# Patient Record
Sex: Male | Born: 1950 | Race: White | Hispanic: No | Marital: Married | Smoking: Current every day smoker
Health system: Southern US, Community
[De-identification: ages and names within clinical notes are randomized; demographics above are authoritative.]

## PROBLEM LIST (undated history)

## (undated) DIAGNOSIS — E1122 Type 2 diabetes mellitus with diabetic chronic kidney disease: Secondary | ICD-10-CM

## (undated) DIAGNOSIS — N289 Disorder of kidney and ureter, unspecified: Secondary | ICD-10-CM

## (undated) DIAGNOSIS — N185 Chronic kidney disease, stage 5: Secondary | ICD-10-CM

## (undated) DIAGNOSIS — E669 Obesity, unspecified: Secondary | ICD-10-CM

## (undated) DIAGNOSIS — E785 Hyperlipidemia, unspecified: Secondary | ICD-10-CM

## (undated) DIAGNOSIS — E039 Hypothyroidism, unspecified: Secondary | ICD-10-CM

## (undated) DIAGNOSIS — E1165 Type 2 diabetes mellitus with hyperglycemia: Secondary | ICD-10-CM

## (undated) DIAGNOSIS — I2510 Atherosclerotic heart disease of native coronary artery without angina pectoris: Secondary | ICD-10-CM

## (undated) DIAGNOSIS — E119 Type 2 diabetes mellitus without complications: Secondary | ICD-10-CM

## (undated) DIAGNOSIS — IMO0002 Reserved for concepts with insufficient information to code with codable children: Secondary | ICD-10-CM

## (undated) DIAGNOSIS — I10 Essential (primary) hypertension: Secondary | ICD-10-CM

## (undated) HISTORY — PX: CORONARY ARTERY BYPASS GRAFT: SHX141

## (undated) HISTORY — DX: Disorder of kidney and ureter, unspecified: N28.9

## (undated) HISTORY — DX: Obesity, unspecified: E66.9

## (undated) HISTORY — DX: Type 2 diabetes mellitus with diabetic chronic kidney disease: E11.22

## (undated) HISTORY — DX: Essential (primary) hypertension: I10

## (undated) HISTORY — DX: Type 2 diabetes mellitus with diabetic chronic kidney disease: E11.65

## (undated) HISTORY — DX: Type 2 diabetes mellitus without complications: E11.9

## (undated) HISTORY — DX: Reserved for concepts with insufficient information to code with codable children: IMO0002

## (undated) HISTORY — DX: Hypothyroidism, unspecified: E03.9

## (undated) HISTORY — DX: Atherosclerotic heart disease of native coronary artery without angina pectoris: I25.10

## (undated) HISTORY — DX: Hyperlipidemia, unspecified: E78.5

## (undated) HISTORY — DX: Chronic kidney disease, stage 5: N18.5

---

## 2014-12-21 ENCOUNTER — Other Ambulatory Visit: Payer: Self-pay

## 2014-12-21 LAB — HEMOGLOBIN A1C: Hgb A1c MFr Bld: 7.3 % — AB (ref 4.0–6.0)

## 2014-12-21 MED ORDER — METOPROLOL TARTRATE 50 MG PO TABS: 50.0000 mg | ORAL_TABLET | Freq: Two times a day (BID) | ORAL | Status: DC

## 2014-12-28 ENCOUNTER — Other Ambulatory Visit: Payer: Self-pay

## 2014-12-28 MED ORDER — SITAGLIPTIN PHOSPHATE 25 MG PO TABS
25.0000 mg | ORAL_TABLET | Freq: Every day | ORAL | Status: DC
Start: 2014-12-28 — End: 2015-01-11

## 2015-01-05 ENCOUNTER — Ambulatory Visit: Payer: Self-pay | Admitting: "Endocrinology

## 2015-01-11 ENCOUNTER — Encounter: Payer: Self-pay | Admitting: "Endocrinology

## 2015-01-11 ENCOUNTER — Ambulatory Visit (INDEPENDENT_AMBULATORY_CARE_PROVIDER_SITE_OTHER): Payer: Federal, State, Local not specified - PPO | Admitting: "Endocrinology

## 2015-01-11 VITALS — BP 152/73 | HR 72 | Ht 72.0 in | Wt 309.0 lb

## 2015-01-11 DIAGNOSIS — E1122 Type 2 diabetes mellitus with diabetic chronic kidney disease: Secondary | ICD-10-CM

## 2015-01-11 DIAGNOSIS — E785 Hyperlipidemia, unspecified: Secondary | ICD-10-CM

## 2015-01-11 DIAGNOSIS — E039 Hypothyroidism, unspecified: Secondary | ICD-10-CM

## 2015-01-11 DIAGNOSIS — E559 Vitamin D deficiency, unspecified: Secondary | ICD-10-CM | POA: Diagnosis not present

## 2015-01-11 DIAGNOSIS — I10 Essential (primary) hypertension: Secondary | ICD-10-CM

## 2015-01-11 DIAGNOSIS — E782 Mixed hyperlipidemia: Secondary | ICD-10-CM | POA: Insufficient documentation

## 2015-01-11 DIAGNOSIS — E1159 Type 2 diabetes mellitus with other circulatory complications: Secondary | ICD-10-CM

## 2015-01-11 DIAGNOSIS — N184 Chronic kidney disease, stage 4 (severe): Secondary | ICD-10-CM | POA: Diagnosis not present

## 2015-01-11 MED ORDER — LIRAGLUTIDE 18 MG/3ML ~~LOC~~ SOPN: 1.8000 mg | PEN_INJECTOR | Freq: Every day | SUBCUTANEOUS | Status: DC

## 2015-01-11 MED ORDER — LEVOTHYROXINE SODIUM 125 MCG PO TABS: 250.0000 ug | ORAL_TABLET | Freq: Every day | ORAL | Status: DC

## 2015-01-11 MED ORDER — VITAMIN D (ERGOCALCIFEROL) 1.25 MG (50000 UNIT) PO CAPS: 50000.0000 [IU] | ORAL_CAPSULE | ORAL | Status: DC

## 2015-01-11 MED ORDER — INSULIN GLARGINE 100 UNIT/ML SOLOSTAR PEN: 15.0000 [IU] | PEN_INJECTOR | Freq: Every day | SUBCUTANEOUS | Status: DC

## 2015-01-11 MED ORDER — FUROSEMIDE 20 MG PO TABS: 20.0000 mg | ORAL_TABLET | Freq: Every day | ORAL | Status: DC

## 2015-01-11 NOTE — Progress Notes (Signed)
Subjective:    Patient ID: Cory Steele, male    DOB: Dec 20, 1950,    Past Medical History  Diagnosis Date  . Hypertension   . CAD (coronary artery disease)   . Kidney disease   . Thyroid disease   . Obesity   . Hyperlipidemia   . Diabetes mellitus, type II [facility])    Past Surgical History  Procedure Laterality Date  . Coronary artery bypass graft     Social History   Social History  . Marital Status: Married    Spouse Name: N/A  . Number of Children: N/A  . Years of Education: N/A   Social History Main Topics  . Smoking status: Current Every Day Smoker  . Smokeless tobacco: None  . Alcohol Use: No  . Drug Use: No  . Sexual Activity: Not Asked   Other Topics Concern  . None   Social History Narrative  . None   Outpatient Encounter Prescriptions as of 01/11/2015  Medication Sig  . amLODipine (NORVASC) 5 MG tablet Take 5 mg by mouth daily.  Marland Kitchen aspirin 325 MG tablet Take 325 mg by mouth daily.  . B Complex Vitamins (VITAMIN-B COMPLEX PO) Take by mouth.  . cholecalciferol (VITAMIN D) 1000 UNITS tablet Take 1,000 Units by mouth daily.  . fenofibrate micronized (LOFIBRA) 67 MG capsule Take 67 mg by mouth daily before breakfast.  . furosemide (LASIX) 20 MG tablet Take 1 tablet (20 mg total) by mouth daily.  . Insulin Glargine (LANTUS SOLOSTAR) 100 UNIT/ML Solostar Pen Inject 15 Units into the skin daily at 10 pm.  . levothyroxine (SYNTHROID, LEVOTHROID) 125 MCG tablet Take 2 tablets (250 mcg total) by mouth daily before breakfast.  . Liraglutide (VICTOZA) 18 MG/3ML SOPN Inject 0.3 mLs (1.8 mg total) into the skin daily.  Marland Kitchen losartan (COZAAR) 100 MG tablet Take 100 mg by mouth daily.  . metoprolol (LOPRESSOR) 50 MG tablet Take 1 tablet (50 mg total) by mouth 2 (two) times daily.  . pravastatin (PRAVACHOL) 40 MG tablet Take 40 mg by mouth daily.  . [DISCONTINUED] furosemide (LASIX) 20 MG tablet Take 20 mg by mouth.  . [DISCONTINUED] Insulin Glargine (LANTUS SOLOSTAR)  100 UNIT/ML Solostar Pen Inject into the skin at bedtime.  . [DISCONTINUED] levothyroxine (SYNTHROID, LEVOTHROID) 125 MCG tablet Take 125 mcg by mouth daily before breakfast.  . [DISCONTINUED] Liraglutide (VICTOZA) 18 MG/3ML SOPN Inject 1.8 mg into the skin daily.  . [DISCONTINUED] sitaGLIPtin (JANUVIA) 25 MG tablet Take 1 tablet (25 mg total) by mouth daily.  . Vitamin D, Ergocalciferol, (DRISDOL) 50000 UNITS CAPS capsule Take 1 capsule (50,000 Units total) by mouth every 7 (seven) days.   No facility-administered encounter medications on file as of 01/11/2015.   ALLERGIES: Allergies  Allergen Reactions  . Codeine   . Vancomycin    VACCINATION STATUS:  There is no immunization history on file for this patient.  Diabetes He presents for his follow-up diabetic visit. He has type 2 diabetes mellitus. Onset time: He was diagnosed at approximate age of 60 years. His disease course has been stable. There are no hypoglycemic associated symptoms. Pertinent negatives for hypoglycemia include no confusion, headaches, nervousness/anxiousness, pallor or seizures. There are no diabetic associated symptoms. Pertinent negatives for diabetes include no chest pain, no fatigue, no polydipsia, no polyphagia, no polyuria and no weakness. There are no hypoglycemic complications. Symptoms are stable. Diabetic complications include heart disease, nephropathy, peripheral neuropathy and PVD. Risk factors for coronary artery disease include diabetes mellitus,  dyslipidemia, male sex, obesity and tobacco exposure. He is compliant with treatment most of the time. His weight is stable. He rarely participates in exercise. His overall blood glucose range is 140-180 mg/dl. An ACE inhibitor/angiotensin II receptor blocker is being taken. Eye exam is current.  Hyperlipidemia This is a chronic problem. The current episode started more than 1 year ago. Pertinent negatives include no chest pain, myalgias or shortness of breath.  Current antihyperlipidemic treatment includes statins. Risk factors for coronary artery disease include dyslipidemia, diabetes mellitus, hypertension, male sex and obesity.  Hypertension Pertinent negatives include no chest pain, headaches, neck pain, palpitations or shortness of breath. Hypertensive end-organ damage includes PVD and a thyroid problem.  Thyroid Problem Presents for follow-up visit. Patient reports no anxiety, constipation, diarrhea, fatigue or palpitations. The symptoms have been stable. Past treatments include levothyroxine. His past medical history is significant for hyperlipidemia.     Review of Systems  Constitutional: Negative for fatigue and unexpected weight change.  HENT: Negative for dental problem, mouth sores and trouble swallowing.   Eyes: Negative for visual disturbance.  Respiratory: Negative for cough, choking, chest tightness, shortness of breath and wheezing.   Cardiovascular: Negative for chest pain, palpitations and leg swelling.  Gastrointestinal: Negative for nausea, vomiting, abdominal pain, diarrhea, constipation and abdominal distention.  Endocrine: Negative for polydipsia, polyphagia and polyuria.  Genitourinary: Negative for dysuria, urgency, hematuria and flank pain.  Musculoskeletal: Negative for myalgias, back pain, gait problem and neck pain.  Skin: Negative for pallor, rash and wound.  Neurological: Negative for seizures, syncope, weakness, numbness and headaches.  Psychiatric/Behavioral: Negative.  Negative for confusion and dysphoric mood. The patient is not nervous/anxious.     Objective:    BP 152/73 mmHg  Pulse 72  Ht 6' (1.829 m)  Wt 309 lb (140.161 kg)  BMI 41.90 kg/m2  SpO2 99%  Wt Readings from Last 3 Encounters:  01/11/15 309 lb (140.161 kg)    Physical Exam  Constitutional: He is oriented to person, place, and time. He appears well-developed and well-nourished. He is cooperative. No distress.  HENT:  Head: Normocephalic  and atraumatic.  Eyes: EOM are normal.  Neck: Normal range of motion. Neck supple. No tracheal deviation present. No thyromegaly present.  Cardiovascular: Normal rate, S1 normal, S2 normal and normal heart sounds.  Exam reveals no gallop.   No murmur heard. Pulses:      Dorsalis pedis pulses are 1+ on the right side, and 1+ on the left side.       Posterior tibial pulses are 1+ on the right side, and 1+ on the left side.  Pulmonary/Chest: Breath sounds normal. No respiratory distress. He has no wheezes.  Abdominal: Soft. Bowel sounds are normal. He exhibits no distension. There is no tenderness. There is no guarding and no CVA tenderness.  Musculoskeletal: He exhibits no edema.       Right shoulder: He exhibits no swelling and no deformity.  Neurological: He is alert and oriented to person, place, and time. He has normal strength and normal reflexes. No cranial nerve deficit or sensory deficit. Gait normal.  Skin: Skin is warm and dry. No rash noted. No cyanosis. Nails show no clubbing.  Psychiatric: He has a normal mood and affect. His speech is normal and behavior is normal. Judgment and thought content normal. Cognition and memory are normal.    Results for orders placed or performed in visit on 01/11/15  Hemoglobin A1c  Result Value Ref Range   Hgb A1c  MFr Bld 7.3 (A) 4.0 - 6.0 %   Complete Blood Count (Most recent): No results found for: WBC, HGB, HCT, MCV, PLT Chemistry (most recent): No results found for: NA, K, CL, CO2, BUN, CREATININE, GLUF Diabetic Labs (most recent): Lab Results  Component Value Date   HGBA1C 7.3* 12/21/2014   Lipid profile (most recent): No results found for: TRIG, CHOL       Assessment & Plan:   1. 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  Patient came with stable glucose profile, and   recent A1c of 7.3 %.  Glucose logs and insulin administration records pertaining to this visit,  to be scanned into patient's records.  Recent labs reviewed. - Patient remains at a high risk for more acute and chronic complications of diabetes which include CAD, CVA, CKD, retinopathy, and neuropathy. These are all discussed in detail with the patient.  - I have re-counseled the patient on diet management and weight loss  by adopting a carbohydrate restricted / protein rich  Diet. - Patient is advised to stick to a routine mealtimes to eat 3 meals  a day and avoid unnecessary snacks ( to snack only to correct hypoglycemia).   - Suggestion is made for patient to avoid simple carbohydrates   from their diet including Cakes , Desserts, Ice Cream,  Soda (  diet and regular) , Sweet Tea , Candies,  Chips, Cookies, Artificial Sweeteners,   and "Sugar-free" Products .  This will help patient to have stable blood glucose profile and potentially avoid unintended  Weight gain.  - I have approached patient with the following individualized plan to manage diabetes and patient agrees.  - Continue Lantus  15 units qhs, associated with strict monitoring of BG qAM.  -Discontinue Januvia. -He will not need bolus insulin for now. -Patient is encouraged to call clinic for blood glucose levels less than 70 or above 300 mg /dl.  -Continue Victoza 1.8 mg daily. -Adjustment parameters are given for hypo and hyperglycemia in writing. -Patient is encouraged to call clinic for blood glucose levels less than 70 or above 300 mg /dl.  -Patient is not a candidate for metformin andSGLT2 inhibitors due to CKD. -He is advised to maintain close follow up with his nephrologist.  - Patient specific target  for A1c; LDL, HDL, Triglycerides, and  Waist Circumference were discussed in detail.  2) BP/HTN: Controlled. Continue current medications including ARB. 3) Lipids/HPL: continue statins. 4)  Weight/Diet:  exercise, and  carbohydrates information provided.  5)  Primary hypothyroidism His labs are suggestive of a slight over replacement, however I would continue on current dose of levothyroxine at 2050 g by mouth every morning.  - We discussed about correct intake of levothyroxine, at fasting, with water, separated by at least 30 minutes from breakfast, and separated by more than 4 hours from calcium, iron, multivitamins, acid reflux medications (PPIs). -Patient is made aware of the fact that thyroid hormone replacement is needed for life, dose to be adjusted by periodic monitoring of thyroid function tests.  6) Vitamin D deficiency -His vitamin D level is still low at 12. He'll benefit from continued vitamin D replacement. I will reinitiate ergocalciferol 50,000 units weekly for the next 12 weeks.  7) Chronic Care/Health Maintenance:  -Patient  on ARBI and Statin medications and encouraged to continue to follow up with Ophthalmology,  Podiatrist at least yearly or according to recommendations, and advised to  stay away from smoking. I have recommended yearly flu vaccine and pneumonia vaccination at least every 5 years; moderate intensity exercise for up to 150 minutes weekly; and  sleep for at least 7 hours a day.  I advised patient to maintain close follow up with their PCP for primary care needs.  Patient is asked to bring meter and  blood glucose logs during their next visit.   Follow up plan: Return in about 3 months (around 04/13/2015) for diabetes, high blood pressure, high cholesterol, underactive thyroid, follow up with pre-visit labs, meter, and logs.  Glade Lloyd, MD Phone: [phone]  Fax: [phone]   01/11/2015, 2:52 PM

## 2015-01-11 NOTE — Patient Instructions (Signed)

## 2015-02-21 ENCOUNTER — Other Ambulatory Visit: Payer: Self-pay | Admitting: "Endocrinology

## 2015-02-23 ENCOUNTER — Other Ambulatory Visit: Payer: Self-pay

## 2015-02-23 MED ORDER — AMLODIPINE BESYLATE 5 MG PO TABS: 5.0000 mg | ORAL_TABLET | Freq: Every day | ORAL | Status: DC

## 2015-03-13 ENCOUNTER — Other Ambulatory Visit: Payer: Self-pay | Admitting: "Endocrinology

## 2015-04-17 LAB — HEMOGLOBIN A1C: Hemoglobin A1C: 9.7

## 2015-04-18 ENCOUNTER — Ambulatory Visit: Payer: Federal, State, Local not specified - PPO | Admitting: "Endocrinology

## 2015-04-26 ENCOUNTER — Ambulatory Visit (INDEPENDENT_AMBULATORY_CARE_PROVIDER_SITE_OTHER): Payer: Federal, State, Local not specified - PPO | Admitting: "Endocrinology

## 2015-04-26 ENCOUNTER — Encounter: Payer: Self-pay | Admitting: "Endocrinology

## 2015-04-26 VITALS — BP 128/68 | HR 80 | Ht 72.0 in | Wt 294.0 lb

## 2015-04-26 DIAGNOSIS — E785 Hyperlipidemia, unspecified: Secondary | ICD-10-CM

## 2015-04-26 DIAGNOSIS — E039 Hypothyroidism, unspecified: Secondary | ICD-10-CM | POA: Diagnosis not present

## 2015-04-26 DIAGNOSIS — E1122 Type 2 diabetes mellitus with diabetic chronic kidney disease: Secondary | ICD-10-CM

## 2015-04-26 DIAGNOSIS — N184 Chronic kidney disease, stage 4 (severe): Secondary | ICD-10-CM

## 2015-04-26 DIAGNOSIS — E559 Vitamin D deficiency, unspecified: Secondary | ICD-10-CM | POA: Diagnosis not present

## 2015-04-26 DIAGNOSIS — I10 Essential (primary) hypertension: Secondary | ICD-10-CM

## 2015-04-26 DIAGNOSIS — E1159 Type 2 diabetes mellitus with other circulatory complications: Secondary | ICD-10-CM | POA: Diagnosis not present

## 2015-04-26 MED ORDER — INSULIN GLARGINE 100 UNIT/ML SOLOSTAR PEN: 20.0000 [IU] | PEN_INJECTOR | Freq: Every day | SUBCUTANEOUS | Status: DC

## 2015-04-26 MED ORDER — VITAMIN D (ERGOCALCIFEROL) 1.25 MG (50000 UNIT) PO CAPS: 50000.0000 [IU] | ORAL_CAPSULE | ORAL | Status: DC

## 2015-04-26 NOTE — Patient Instructions (Signed)

## 2015-04-26 NOTE — Progress Notes (Signed)
Subjective:    Patient ID: Cory Steele, male    DOB: [birth year],    Past Medical History  Diagnosis Date  . Hypertension   . CAD (coronary artery disease)   . Kidney disease   . Thyroid disease   . Obesity   . Hyperlipidemia   . Diabetes mellitus, type II [facility])    Past Surgical History  Procedure Laterality Date  . Coronary artery bypass graft     Social History   Social History  . Marital Status: Married    Spouse Name: N/A  . Number of Children: N/A  . Years of Education: N/A   Social History Main Topics  . Smoking status: Current Every Day Smoker  . Smokeless tobacco: None  . Alcohol Use: No  . Drug Use: No  . Sexual Activity: Not Asked   Other Topics Concern  . None   Social History Narrative   Outpatient Encounter Prescriptions as of 04/26/2015  Medication Sig  . gemfibrozil (LOPID) 600 MG tablet Take 600 mg by mouth daily.  Marland Kitchen levothyroxine (SYNTHROID, LEVOTHROID) 125 MCG tablet Take 2 tablets (250 mcg total) by mouth daily before breakfast.  . Liraglutide (VICTOZA) 18 MG/3ML SOPN Inject 0.3 mLs (1.8 mg total) into the skin daily.  . Vitamin D, Ergocalciferol, (DRISDOL) 50000 units CAPS capsule Take 1 capsule (50,000 Units total) by mouth 2 (two) times a week.  . [DISCONTINUED] Insulin Glargine (LANTUS SOLOSTAR) 100 UNIT/ML Solostar Pen Inject 15 Units into the skin daily at 10 pm.  . [DISCONTINUED] Vitamin D, Ergocalciferol, (DRISDOL) 50000 UNITS CAPS capsule Take 1 capsule (50,000 Units total) by mouth every 7 (seven) days.  Marland Kitchen amLODipine (NORVASC) 5 MG tablet Take 1 tablet (5 mg total) by mouth daily.  Marland Kitchen aspirin 325 MG tablet Take 325 mg by mouth daily.  . B Complex Vitamins (VITAMIN-B COMPLEX PO) Take by mouth.  . cholecalciferol (VITAMIN D) 1000 UNITS tablet Take 1,000 Units by mouth daily.  . furosemide (LASIX) 20 MG tablet Take 1 tablet (20 mg total) by mouth daily.  . Insulin Glargine (BASAGLAR KWIKPEN) 100 UNIT/ML Solostar Pen Inject 20 Units  into the skin daily at 10 pm.  . losartan (COZAAR) 100 MG tablet Take 100 mg by mouth daily.  . metoprolol (LOPRESSOR) 50 MG tablet TAKE (1) TABLET TWICE A DAY.  . pravastatin (PRAVACHOL) 40 MG tablet Take 40 mg by mouth daily.  . [DISCONTINUED] fenofibrate micronized (LOFIBRA) 67 MG capsule Take 67 mg by mouth daily before breakfast.   No facility-administered encounter medications on file as of 04/26/2015.   ALLERGIES: Allergies  Allergen Reactions  . Codeine   . Vancomycin    VACCINATION STATUS:  There is no immunization history on file for this patient.  Diabetes He presents for his follow-up diabetic visit. He has type 2 diabetes mellitus. Onset time: He was diagnosed at approximate age of 4 years. His disease course has been worsening. There are no hypoglycemic associated symptoms. Pertinent negatives for hypoglycemia include no confusion, headaches, nervousness/anxiousness, pallor or seizures. Associated symptoms include polydipsia and polyuria. Pertinent negatives for diabetes include no chest pain, no fatigue, no polyphagia and no weakness. There are no hypoglycemic complications. Symptoms are worsening. Diabetic complications include heart disease, nephropathy, peripheral neuropathy and PVD. Risk factors for coronary artery disease include diabetes mellitus, dyslipidemia, male sex, obesity and tobacco exposure. He is compliant with treatment most of the time. His weight is stable. He is following a generally unhealthy diet. He rarely participates  in exercise. His breakfast blood glucose range is generally 140-180 mg/dl. (He only monitors blood glucose before breakfast.) An ACE inhibitor/angiotensin II receptor blocker is being taken. Eye exam is current.  Hyperlipidemia This is a chronic problem. The current episode started more than 1 year ago. Pertinent negatives include no chest pain, myalgias or shortness of breath. Current antihyperlipidemic treatment includes statins. Risk factors  for coronary artery disease include dyslipidemia, diabetes mellitus, hypertension, male sex and obesity.  Hypertension Pertinent negatives include no chest pain, headaches, neck pain, palpitations or shortness of breath. Hypertensive end-organ damage includes PVD and a thyroid problem.  Thyroid Problem Presents for follow-up visit. Patient reports no anxiety, constipation, diarrhea, fatigue or palpitations. The symptoms have been stable. Past treatments include levothyroxine. His past medical history is significant for hyperlipidemia.     Review of Systems  Constitutional: Negative for fatigue and unexpected weight change.  HENT: Negative for dental problem, mouth sores and trouble swallowing.   Eyes: Negative for visual disturbance.  Respiratory: Negative for cough, choking, chest tightness, shortness of breath and wheezing.   Cardiovascular: Negative for chest pain, palpitations and leg swelling.  Gastrointestinal: Negative for nausea, vomiting, abdominal pain, diarrhea, constipation and abdominal distention.  Endocrine: Positive for polydipsia and polyuria. Negative for polyphagia.  Genitourinary: Negative for dysuria, urgency, hematuria and flank pain.  Musculoskeletal: Negative for myalgias, back pain, gait problem and neck pain.  Skin: Negative for pallor, rash and wound.  Neurological: Negative for seizures, syncope, weakness, numbness and headaches.  Psychiatric/Behavioral: Negative.  Negative for confusion and dysphoric mood. The patient is not nervous/anxious.     Objective:    BP 128/68 mmHg  Pulse 80  Ht 6' (1.829 m)  Wt 294 lb (133.358 kg)  BMI 39.86 kg/m2  SpO2 99%  Wt Readings from Last 3 Encounters:  04/26/15 294 lb (133.358 kg)  01/11/15 309 lb (140.161 kg)    Physical Exam  Constitutional: He is oriented to person, place, and time. He appears well-developed and well-nourished. He is cooperative. No distress.  HENT:  Head: Normocephalic and atraumatic.  Eyes:  EOM are normal.  Neck: Normal range of motion. Neck supple. No tracheal deviation present. No thyromegaly present.  Cardiovascular: Normal rate, S1 normal, S2 normal and normal heart sounds.  Exam reveals no gallop.   No murmur heard. Pulses:      Dorsalis pedis pulses are 1+ on the right side, and 1+ on the left side.       Posterior tibial pulses are 1+ on the right side, and 1+ on the left side.  Pulmonary/Chest: Breath sounds normal. No respiratory distress. He has no wheezes.  Abdominal: Soft. Bowel sounds are normal. He exhibits no distension. There is no tenderness. There is no guarding and no CVA tenderness.  Musculoskeletal: He exhibits no edema.       Right shoulder: He exhibits no swelling and no deformity.  Neurological: He is alert and oriented to person, place, and time. He has normal strength and normal reflexes. No cranial nerve deficit or sensory deficit. Gait normal.  Skin: Skin is warm and dry. No rash noted. No cyanosis. Nails show no clubbing.  Psychiatric: He has a normal mood and affect. His speech is normal and behavior is normal. Judgment and thought content normal. Cognition and memory are normal.    Results for orders placed or performed in visit on 04/26/15  Hemoglobin A1c  Result Value Ref Range   Hemoglobin A1C 9.7     -Labs are  scanned.   Diabetic Labs (most recent): Lab Results  Component Value Date   HGBA1C 9.7 04/17/2015   HGBA1C 7.3* 12/21/2014      Assessment & Plan:   1. 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  Patient came with elevated glucose profile even at fasting, and  recent A1c of 9.7% increased from 7.3 %.  -He did have pneumonia which required therapy with steroids and antibiotics in the interim.  Glucose logs and insulin administration records pertaining to this visit,   to be scanned into patient's records.  Recent labs reviewed. - Patient remains at a high risk for more acute and chronic complications of diabetes which include CAD, CVA, CKD, retinopathy, and neuropathy. These are all discussed in detail with the patient.  - I have re-counseled the patient on diet management and weight loss  by adopting a carbohydrate restricted / protein rich  Diet. - Patient is advised to stick to a routine mealtimes to eat 3 meals  a day and avoid unnecessary snacks ( to snack only to correct hypoglycemia).   - Suggestion is made for patient to avoid simple carbohydrates   from their diet including Cakes , Desserts, Ice Cream,  Soda (  diet and regular) , Sweet Tea , Candies,  Chips, Cookies, Artificial Sweeteners,   and "Sugar-free" Products .  This will help patient to have stable blood glucose profile and potentially avoid unintended  Weight gain.  - I have approached patient with the following individualized plan to manage diabetes and patient agrees.  - Change Lantus  To Basaglar and increased to 20 units qhs, associated with strict monitoring of BG  BEFORE MEALS AND AT BEDTIME AND RETURN IN 2 WEEKS FOR REEVALUATION.   -He may need bolus insulin based on his glucose readings.  -Patient is encouraged to call clinic for blood glucose levels less than 70 or above 300 mg /dl.  -Continue Victoza 1.8 mg daily. -Adjustment parameters are given for hypo and hyperglycemia in writing. -Patient is encouraged to call clinic for blood glucose levels less than 70 or above 300 mg /dl.  -Patient is not a candidate for metformin andSGLT2 inhibitors due to CKD. -He is advised to maintain close follow up with his nephrologist. His renal function is seen to be improving.   - Patient specific target  for A1c; LDL, HDL, Triglycerides, and  Waist Circumference were discussed in detail.  2) BP/HTN: Controlled. Continue current medications including ARB. 3) Lipids/HPL: continue  statins. 4)  Weight/Diet:  exercise, and carbohydrates information provided.  5)  Primary hypothyroidism His labs are suggestive of a slight over replacement, however I would continue on current dose of levothyroxine at 250 g by mouth every morning.  - We discussed about correct intake of levothyroxine, at fasting, with water, separated by at least 30 minutes from breakfast, and separated by more than 4 hours from calcium, iron, multivitamins, acid reflux medications (PPIs). -Patient is made aware of the fact that thyroid hormone replacement is needed for life, dose to be adjusted by periodic monitoring of thyroid function tests.  6) Vitamin D deficiency -His vitamin D level is  even lower at 9. I advised him to increase vitamin D 50,000 units twice weekly.   7) Chronic Care/Health Maintenance:  -Patient  on ARBI and Statin medications and encouraged to continue to follow up with Ophthalmology,  Podiatrist at least yearly or according to recommendations, and advised to  stay away from smoking. I have recommended yearly flu vaccine and pneumonia vaccination at least every 5 years; moderate intensity exercise for up to 150 minutes weekly; and  sleep for at least 7 hours a day.  I advised patient to maintain close follow up with their PCP for primary care needs.  Patient is asked to bring meter and  blood glucose logs during their next visit.   Follow up plan: Return in about 2 weeks (around 05/10/2015) for follow up with meter and logs- no labs, diabetes, high blood pressure, high cholesterol, underactive thyroid.  Glade Lloyd, MD Phone: [phone]  Fax: [phone]   04/26/2015, 10:26 AM

## 2015-05-09 ENCOUNTER — Other Ambulatory Visit: Payer: Self-pay | Admitting: "Endocrinology

## 2015-05-09 NOTE — Telephone Encounter (Signed)
No, he has to communicate with his nephrologist for this medication.

## 2015-05-09 NOTE — Telephone Encounter (Signed)
Do we prescribe this medication for Mr. Onstott?

## 2015-05-11 ENCOUNTER — Encounter: Payer: Self-pay | Admitting: "Endocrinology

## 2015-05-11 ENCOUNTER — Ambulatory Visit (INDEPENDENT_AMBULATORY_CARE_PROVIDER_SITE_OTHER): Payer: Federal, State, Local not specified - PPO | Admitting: "Endocrinology

## 2015-05-11 VITALS — BP 134/73 | HR 80 | Ht 72.0 in | Wt 292.0 lb

## 2015-05-11 DIAGNOSIS — E1159 Type 2 diabetes mellitus with other circulatory complications: Secondary | ICD-10-CM

## 2015-05-11 DIAGNOSIS — I10 Essential (primary) hypertension: Secondary | ICD-10-CM | POA: Diagnosis not present

## 2015-05-11 DIAGNOSIS — E039 Hypothyroidism, unspecified: Secondary | ICD-10-CM

## 2015-05-11 DIAGNOSIS — E785 Hyperlipidemia, unspecified: Secondary | ICD-10-CM

## 2015-05-11 DIAGNOSIS — E559 Vitamin D deficiency, unspecified: Secondary | ICD-10-CM

## 2015-05-11 MED ORDER — SITAGLIPTIN PHOSPHATE 25 MG PO TABS: 25.0000 mg | ORAL_TABLET | Freq: Every day | ORAL | Status: DC

## 2015-05-11 NOTE — Progress Notes (Signed)
Subjective:    Patient ID: Cory Steele, male    DOB: Jul 30, 1950,    Past Medical History  Diagnosis Date  . Hypertension   . CAD (coronary artery disease)   . Kidney disease   . Thyroid disease   . Obesity   . Hyperlipidemia   . Diabetes mellitus, type II [facility])    Past Surgical History  Procedure Laterality Date  . Coronary artery bypass graft     Social History   Social History  . Marital Status: Married    Spouse Name: N/A  . Number of Children: N/A  . Years of Education: N/A   Social History Main Topics  . Smoking status: Current Every Day Smoker  . Smokeless tobacco: None  . Alcohol Use: No  . Drug Use: No  . Sexual Activity: Not Asked   Other Topics Concern  . None   Social History Narrative   Outpatient Encounter Prescriptions as of 05/11/2015  Medication Sig  . amLODipine (NORVASC) 5 MG tablet Take 1 tablet (5 mg total) by mouth daily.  Marland Kitchen aspirin 325 MG tablet Take 325 mg by mouth daily.  . B Complex Vitamins (VITAMIN-B COMPLEX PO) Take by mouth.  . cholecalciferol (VITAMIN D) 1000 UNITS tablet Take 1,000 Units by mouth daily.  . furosemide (LASIX) 20 MG tablet Take 1 tablet (20 mg total) by mouth daily.  Marland Kitchen gemfibrozil (LOPID) 600 MG tablet Take 600 mg by mouth daily.  . Insulin Glargine (BASAGLAR KWIKPEN) 100 UNIT/ML Solostar Pen Inject 20 Units into the skin daily at 10 pm.  . levothyroxine (SYNTHROID, LEVOTHROID) 125 MCG tablet Take 2 tablets (250 mcg total) by mouth daily before breakfast.  . Liraglutide (VICTOZA) 18 MG/3ML SOPN Inject 0.3 mLs (1.8 mg total) into the skin daily.  Marland Kitchen losartan (COZAAR) 100 MG tablet Take 100 mg by mouth daily.  . metoprolol (LOPRESSOR) 50 MG tablet TAKE (1) TABLET TWICE A DAY.  . pravastatin (PRAVACHOL) 40 MG tablet Take 40 mg by mouth daily.  . sitaGLIPtin (JANUVIA) 25 MG tablet Take 1 tablet (25 mg total) by mouth daily.  . Vitamin D, Ergocalciferol, (DRISDOL) 50000 units CAPS capsule Take 1 capsule (50,000 Units  total) by mouth 2 (two) times a week.   No facility-administered encounter medications on file as of 05/11/2015.   ALLERGIES: Allergies  Allergen Reactions  . Codeine   . Vancomycin    VACCINATION STATUS:  There is no immunization history on file for this patient.  Diabetes He presents for his follow-up diabetic visit. He has type 2 diabetes mellitus. Onset time: He was diagnosed at approximate age of 25 years. His disease course has been improving. There are no hypoglycemic associated symptoms. Pertinent negatives for hypoglycemia include no confusion, headaches, nervousness/anxiousness, pallor or seizures. Pertinent negatives for diabetes include no chest pain, no fatigue, no polydipsia, no polyphagia, no polyuria and no weakness. There are no hypoglycemic complications. Symptoms are improving. Diabetic complications include heart disease, nephropathy, peripheral neuropathy and PVD. Risk factors for coronary artery disease include diabetes mellitus, dyslipidemia, male sex, obesity and tobacco exposure. He is compliant with treatment most of the time. His weight is stable. He is following a generally unhealthy diet. He rarely participates in exercise. His breakfast blood glucose range is generally 140-180 mg/dl. His lunch blood glucose range is generally 140-180 mg/dl. His dinner blood glucose range is generally 140-180 mg/dl. His overall blood glucose range is 140-180 mg/dl. (He only monitors blood glucose before breakfast.) An ACE inhibitor/angiotensin  II receptor blocker is being taken. Eye exam is current.  Hyperlipidemia This is a chronic problem. The current episode started more than 1 year ago. Pertinent negatives include no chest pain, myalgias or shortness of breath. Current antihyperlipidemic treatment includes statins. Risk factors for coronary artery disease include dyslipidemia, diabetes mellitus, hypertension, male sex and obesity.  Hypertension Pertinent negatives include no chest  pain, headaches, neck pain, palpitations or shortness of breath. Hypertensive end-organ damage includes PVD and a thyroid problem.  Thyroid Problem Presents for follow-up visit. Patient reports no anxiety, constipation, diarrhea, fatigue or palpitations. The symptoms have been stable. Past treatments include levothyroxine. His past medical history is significant for hyperlipidemia.     Review of Systems  Constitutional: Negative for fatigue and unexpected weight change.  HENT: Negative for dental problem, mouth sores and trouble swallowing.   Eyes: Negative for visual disturbance.  Respiratory: Negative for cough, choking, chest tightness, shortness of breath and wheezing.   Cardiovascular: Negative for chest pain, palpitations and leg swelling.  Gastrointestinal: Negative for nausea, vomiting, abdominal pain, diarrhea, constipation and abdominal distention.  Endocrine: Negative for polydipsia, polyphagia and polyuria.  Genitourinary: Negative for dysuria, urgency, hematuria and flank pain.  Musculoskeletal: Negative for myalgias, back pain, gait problem and neck pain.  Skin: Negative for pallor, rash and wound.  Neurological: Negative for seizures, syncope, weakness, numbness and headaches.  Psychiatric/Behavioral: Negative.  Negative for confusion and dysphoric mood. The patient is not nervous/anxious.     Objective:    BP 134/73 mmHg  Pulse 80  Ht 6' (1.829 m)  Wt 292 lb (132.45 kg)  BMI 39.59 kg/m2  SpO2 100%  Wt Readings from Last 3 Encounters:  05/11/15 292 lb (132.45 kg)  04/26/15 294 lb (133.358 kg)  01/11/15 309 lb (140.161 kg)    Physical Exam  Constitutional: He is oriented to person, place, and time. He appears well-developed and well-nourished. He is cooperative. No distress.  HENT:  Head: Normocephalic and atraumatic.  Eyes: EOM are normal.  Neck: Normal range of motion. Neck supple. No tracheal deviation present. No thyromegaly present.  Cardiovascular: Normal  rate, S1 normal, S2 normal and normal heart sounds.  Exam reveals no gallop.   No murmur heard. Pulses:      Dorsalis pedis pulses are 1+ on the right side, and 1+ on the left side.       Posterior tibial pulses are 1+ on the right side, and 1+ on the left side.  Pulmonary/Chest: Breath sounds normal. No respiratory distress. He has no wheezes.  Abdominal: Soft. Bowel sounds are normal. He exhibits no distension. There is no tenderness. There is no guarding and no CVA tenderness.  Musculoskeletal: He exhibits no edema.       Right shoulder: He exhibits no swelling and no deformity.  Neurological: He is alert and oriented to person, place, and time. He has normal strength and normal reflexes. No cranial nerve deficit or sensory deficit. Gait normal.  Skin: Skin is warm and dry. No rash noted. No cyanosis. Nails show no clubbing.  Psychiatric: He has a normal mood and affect. His speech is normal and behavior is normal. Judgment and thought content normal. Cognition and memory are normal.    Results for orders placed or performed in visit on 04/26/15  Hemoglobin A1c  Result Value Ref Range   Hemoglobin A1C 9.7     -Labs are scanned.   Diabetic Labs (most recent): Lab Results  Component Value Date   HGBA1C 9.7  04/17/2015   HGBA1C 7.3* 12/21/2014      Assessment & Plan:   1. 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  Patient came with elevated glucose profile even at fasting, and  recent A1c of 9.7% increased from 7.3 %.  -He did have pneumonia which required therapy with steroids and antibiotics in the interim. -His recent logs show near target blood glucose profile.  Glucose logs and insulin administration records pertaining to this visit,  to be scanned into patient's records.  Recent labs reviewed. - Patient remains at  a high risk for more acute and chronic complications of diabetes which include CAD, CVA, CKD, retinopathy, and neuropathy. These are all discussed in detail with the patient.  - I have re-counseled the patient on diet management and weight loss  by adopting a carbohydrate restricted / protein rich  Diet. - Patient is advised to stick to a routine mealtimes to eat 3 meals  a day and avoid unnecessary snacks ( to snack only to correct hypoglycemia).   - Suggestion is made for patient to avoid simple carbohydrates   from their diet including Cakes , Desserts, Ice Cream,  Soda (  diet and regular) , Sweet Tea , Candies,  Chips, Cookies, Artificial Sweeteners,   and "Sugar-free" Products .  This will help patient to have stable blood glucose profile and potentially avoid unintended  Weight gain.  - I have approached patient with the following individualized plan to manage diabetes and patient agrees.  - Based on his blood glucose readings, he would not need prandial insulin. -I advised him to continue  Basaglar  20 units qhs, associated with strict monitoring of BG  BEFORE BREAKFAST .   -Patient is encouraged to call clinic for blood glucose levels less than 70 or above 300 mg /dl.  -Continue Victoza 1.8 mg daily. -I will add low-dose Januvia 25 mg by mouth every morning. Side effects and precautions discussed with him.   -Patient is encouraged to call clinic for blood glucose levels less than 70 or above 300 mg /dl.  -Patient is not a candidate for metformin andSGLT2 inhibitors due to CKD. -He is advised to maintain close follow up with his nephrologist. His renal function is seen to be improving.   - Patient specific target  for A1c; LDL, HDL, Triglycerides, and  Waist Circumference were discussed in detail.  2) BP/HTN: Controlled. Continue current medications including ARB. 3) Lipids/HPL: continue statins. 4)  Weight/Diet:  exercise, and carbohydrates information provided.  5)  Primary  hypothyroidism His labs are suggestive of a slight over replacement, however I would continue on current dose of levothyroxine at 250 g by mouth every morning.  - We discussed about correct intake of levothyroxine, at fasting, with water, separated by at least 30 minutes from breakfast, and separated by more than 4 hours from calcium, iron, multivitamins, acid reflux medications (PPIs). -Patient is made aware of the fact that thyroid hormone replacement is needed for life, dose to be adjusted by periodic monitoring of thyroid function tests.  6) Vitamin D deficiency -His vitamin D level is  even lower at 9. I advised him to increase vitamin D 50,000 units twice weekly.   7) Chronic Care/Health Maintenance:  -Patient  on ARBI and Statin medications and encouraged to continue to follow up with Ophthalmology, Podiatrist at least yearly or according  to recommendations, and advised to  stay away from smoking. I have recommended yearly flu vaccine and pneumonia vaccination at least every 5 years; moderate intensity exercise for up to 150 minutes weekly; and  sleep for at least 7 hours a day.  I advised patient to maintain close follow up with their PCP for primary care needs.  Patient is asked to bring meter and  blood glucose logs during their next visit.   Follow up plan: Return in about 3 months (around 08/08/2015) for diabetes, high blood pressure, high cholesterol, underactive thyroid, follow up with pre-visit labs, meter, and logs.  Glade Lloyd, MD Phone: [phone]  Fax: [phone]   05/11/2015, 1:53 PM

## 2015-06-19 ENCOUNTER — Other Ambulatory Visit: Payer: Self-pay | Admitting: "Endocrinology

## 2015-06-22 ENCOUNTER — Other Ambulatory Visit: Payer: Self-pay | Admitting: "Endocrinology

## 2015-08-01 ENCOUNTER — Other Ambulatory Visit: Payer: Self-pay | Admitting: "Endocrinology

## 2015-08-01 LAB — BASIC METABOLIC PANEL
BUN: 50 mg/dL — ABNORMAL HIGH (ref 7–25)
CO2: 20 mmol/L (ref 20–31)
CREATININE: 2.78 mg/dL — AB (ref 0.70–1.25)
Calcium: 8.9 mg/dL (ref 8.6–10.3)
Chloride: 109 mmol/L (ref 98–110)
GLUCOSE: 83 mg/dL (ref 65–99)
Potassium: 5.1 mmol/L (ref 3.5–5.3)
SODIUM: 138 mmol/L (ref 135–146)

## 2015-08-01 LAB — TSH: TSH: 0.04 mIU/L — ABNORMAL LOW (ref 0.40–4.50)

## 2015-08-01 LAB — HEMOGLOBIN A1C
HEMOGLOBIN A1C: 7.4 % — AB (ref <5.7)
MEAN PLASMA GLUCOSE: 166 mg/dL

## 2015-08-01 LAB — T4, FREE: Free T4: 1.4 ng/dL (ref 0.8–1.8)

## 2015-08-08 ENCOUNTER — Encounter: Payer: Self-pay | Admitting: "Endocrinology

## 2015-08-08 ENCOUNTER — Ambulatory Visit (INDEPENDENT_AMBULATORY_CARE_PROVIDER_SITE_OTHER): Payer: Federal, State, Local not specified - PPO | Admitting: "Endocrinology

## 2015-08-08 VITALS — BP 141/74 | HR 76 | Ht 72.0 in | Wt 300.0 lb

## 2015-08-08 DIAGNOSIS — E039 Hypothyroidism, unspecified: Secondary | ICD-10-CM

## 2015-08-08 DIAGNOSIS — E785 Hyperlipidemia, unspecified: Secondary | ICD-10-CM

## 2015-08-08 DIAGNOSIS — N184 Chronic kidney disease, stage 4 (severe): Secondary | ICD-10-CM | POA: Diagnosis not present

## 2015-08-08 DIAGNOSIS — E1122 Type 2 diabetes mellitus with diabetic chronic kidney disease: Secondary | ICD-10-CM

## 2015-08-08 DIAGNOSIS — I10 Essential (primary) hypertension: Secondary | ICD-10-CM | POA: Diagnosis not present

## 2015-08-08 NOTE — Patient Instructions (Signed)
Advice for weight management -For most of us the best way to lose weight is by diet management. Generally speaking, diet management means restricting carbohydrate consumption to minimum possible (and to unprocessed or minimally processed complex starch) and increasing protein intake (animal or plant source), fruits, and vegetables.  -Sticking to a routine mealtime to eat 3 meals a day and avoiding unnecessary snacks is shown to have a big role in weight control.  -It is better to avoid simple carbohydrates including: Cakes, Desserts, Ice Cream, Soda (diet and regular), Sweet Tea, Candies, Chips, Cookies, Artificial Sweeteners, and "Sugar-free" Products.   -Exercise: 30 minutes a day 3-4 days a week, or 150 minutes a week. Combine stretch, strength, and aerobic activities. You may seek evaluation by your heart doctor prior to initiating exercise if you have high risk for heart disease.  -If you are interested, we can schedule a visit with Cory Steele, RDN, CDE for individualized nutrition education.  

## 2015-08-08 NOTE — Progress Notes (Signed)
Subjective:    Patient ID: Cory Steele, male    DOB: 08-08-1950,    Past Medical History  Diagnosis Date  . Hypertension   . CAD (coronary artery disease)   . Kidney disease   . Thyroid disease   . Obesity   . Hyperlipidemia   . Diabetes mellitus, type II [facility])    Past Surgical History  Procedure Laterality Date  . Coronary artery bypass graft     Social History   Social History  . Marital Status: Married    Spouse Name: N/A  . Number of Children: N/A  . Years of Education: N/A   Social History Main Topics  . Smoking status: Current Every Day Smoker  . Smokeless tobacco: None  . Alcohol Use: No  . Drug Use: No  . Sexual Activity: Not Asked   Other Topics Concern  . None   Social History Narrative   Outpatient Encounter Prescriptions as of 08/08/2015  Medication Sig  . amLODipine (NORVASC) 5 MG tablet Take 1 tablet (5 mg total) by mouth daily.  Marland Kitchen aspirin 325 MG tablet Take 325 mg by mouth daily.  . B Complex Vitamins (VITAMIN-B COMPLEX PO) Take by mouth.  . B-D ULTRAFINE III SHORT PEN 31G X 8 MM MISC USE TWICE A DAY FOR INJECTING INSULIN  . cholecalciferol (VITAMIN D) 1000 UNITS tablet Take 1,000 Units by mouth daily.  . furosemide (LASIX) 20 MG tablet Take 1 tablet (20 mg total) by mouth daily.  Marland Kitchen gemfibrozil (LOPID) 600 MG tablet Take 600 mg by mouth daily.  . Insulin Glargine (BASAGLAR KWIKPEN) 100 UNIT/ML Solostar Pen Inject 20 Units into the skin daily at 10 pm.  . levothyroxine (SYNTHROID, LEVOTHROID) 125 MCG tablet Take 2 tablets (250 mcg total) by mouth daily before breakfast.  . Liraglutide (VICTOZA) 18 MG/3ML SOPN Inject 0.3 mLs (1.8 mg total) into the skin daily.  Marland Kitchen losartan (COZAAR) 100 MG tablet Take 100 mg by mouth daily.  . metoprolol (LOPRESSOR) 50 MG tablet TAKE (1) TABLET TWICE A DAY.  . pravastatin (PRAVACHOL) 40 MG tablet Take 40 mg by mouth daily.  . TRUE METRIX BLOOD GLUCOSE TEST test strip USE TO TEST BLOOD SUGAR 4 TIMES A DAY  .  Vitamin D, Ergocalciferol, (DRISDOL) 50000 units CAPS capsule Take 1 capsule (50,000 Units total) by mouth 2 (two) times a week.  . [DISCONTINUED] sitaGLIPtin (JANUVIA) 25 MG tablet Take 1 tablet (25 mg total) by mouth daily.   No facility-administered encounter medications on file as of 08/08/2015.   ALLERGIES: Allergies  Allergen Reactions  . Codeine   . Vancomycin    VACCINATION STATUS:  There is no immunization history on file for this patient.  Diabetes He presents for his follow-up diabetic visit. He has type 2 diabetes mellitus. Onset time: He was diagnosed at approximate age of 40 years. His disease course has been improving. There are no hypoglycemic associated symptoms. Pertinent negatives for hypoglycemia include no confusion, headaches, nervousness/anxiousness, pallor or seizures. Pertinent negatives for diabetes include no chest pain, no fatigue, no polydipsia, no polyphagia, no polyuria and no weakness. There are no hypoglycemic complications. Symptoms are improving. Diabetic complications include heart disease, nephropathy, peripheral neuropathy and PVD. Risk factors for coronary artery disease include diabetes mellitus, dyslipidemia, male sex, obesity and tobacco exposure. He is compliant with treatment most of the time. His weight is increasing steadily. He is following a generally unhealthy diet. He rarely participates in exercise. His breakfast blood glucose range is  generally 110-130 mg/dl. (He only monitors blood glucose before breakfast.) An ACE inhibitor/angiotensin II receptor blocker is being taken. Eye exam is current.  Hyperlipidemia This is a chronic problem. The current episode started more than 1 year ago. Pertinent negatives include no chest pain, myalgias or shortness of breath. Current antihyperlipidemic treatment includes statins. Risk factors for coronary artery disease include dyslipidemia, diabetes mellitus, hypertension, male sex and obesity.   Hypertension Pertinent negatives include no chest pain, headaches, neck pain, palpitations or shortness of breath. Hypertensive end-organ damage includes PVD and a thyroid problem.  Thyroid Problem Presents for follow-up visit. Patient reports no anxiety, constipation, diarrhea, fatigue or palpitations. The symptoms have been stable. Past treatments include levothyroxine. His past medical history is significant for hyperlipidemia.     Review of Systems  Constitutional: Negative for fatigue and unexpected weight change.  HENT: Negative for dental problem, mouth sores and trouble swallowing.   Eyes: Negative for visual disturbance.  Respiratory: Negative for cough, choking, chest tightness, shortness of breath and wheezing.   Cardiovascular: Negative for chest pain, palpitations and leg swelling.  Gastrointestinal: Negative for nausea, vomiting, abdominal pain, diarrhea, constipation and abdominal distention.  Endocrine: Negative for polydipsia, polyphagia and polyuria.  Genitourinary: Negative for dysuria, urgency, hematuria and flank pain.  Musculoskeletal: Negative for myalgias, back pain, gait problem and neck pain.  Skin: Negative for pallor, rash and wound.  Neurological: Negative for seizures, syncope, weakness, numbness and headaches.  Psychiatric/Behavioral: Negative.  Negative for confusion and dysphoric mood. The patient is not nervous/anxious.     Objective:    BP 141/74 mmHg  Pulse 76  Ht 6' (1.829 m)  Wt 300 lb (136.079 kg)  BMI 40.68 kg/m2  SpO2 96%  Wt Readings from Last 3 Encounters:  08/08/15 300 lb (136.079 kg)  05/11/15 292 lb (132.45 kg)  04/26/15 294 lb (133.358 kg)    Physical Exam  Constitutional: He is oriented to person, place, and time. He appears well-developed and well-nourished. He is cooperative. No distress.  HENT:  Head: Normocephalic and atraumatic.  Eyes: EOM are normal.  Neck: Normal range of motion. Neck supple. No tracheal deviation  present. No thyromegaly present.  Cardiovascular: Normal rate, S1 normal, S2 normal and normal heart sounds.  Exam reveals no gallop.   No murmur heard. Pulses:      Dorsalis pedis pulses are 1+ on the right side, and 1+ on the left side.       Posterior tibial pulses are 1+ on the right side, and 1+ on the left side.  Pulmonary/Chest: Breath sounds normal. No respiratory distress. He has no wheezes.  Abdominal: Soft. Bowel sounds are normal. He exhibits no distension. There is no tenderness. There is no guarding and no CVA tenderness.  Musculoskeletal: He exhibits no edema.       Right shoulder: He exhibits no swelling and no deformity.  Neurological: He is alert and oriented to person, place, and time. He has normal strength and normal reflexes. No cranial nerve deficit or sensory deficit. Gait normal.  Skin: Skin is warm and dry. No rash noted. No cyanosis. Nails show no clubbing.  Psychiatric: He has a normal mood and affect. His speech is normal and behavior is normal. Judgment and thought content normal. Cognition and memory are normal.    Results for orders placed or performed in visit on 16/38/46  Basic metabolic panel  Result Value Ref Range   Sodium 138 135 - 146 mmol/L   Potassium 5.1 3.5 -  5.3 mmol/L   Chloride 109 98 - 110 mmol/L   CO2 20 20 - 31 mmol/L   Glucose, Bld 83 65 - 99 mg/dL   BUN 50 (H) 7 - 25 mg/dL   Creat 2.78 (H) 0.70 - 1.25 mg/dL   Calcium 8.9 8.6 - 10.3 mg/dL  TSH  Result Value Ref Range   TSH 0.04 (L) 0.40 - 4.50 mIU/L  T4, free  Result Value Ref Range   Free T4 1.4 0.8 - 1.8 ng/dL  Hemoglobin A1c  Result Value Ref Range   Hgb A1c MFr Bld 7.4 (H) <5.7 %   Mean Plasma Glucose 166 mg/dL       Diabetic Labs (most recent): Lab Results  Component Value Date   HGBA1C 7.4* 08/01/2015   HGBA1C 9.7 04/17/2015   HGBA1C 7.3* 12/21/2014      Assessment & Plan:   1. 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  Patient came with elevated glucose profile even at fasting, and  recent A1c  7.4% improving from  9.7% .  -His recent logs show near target blood glucose profile.  Glucose logs and insulin administration records pertaining to this visit,  to be scanned into patient's records.  Recent labs reviewed. - Patient remains at a high risk for more acute and chronic complications of diabetes which include CAD, CVA, CKD, retinopathy, and neuropathy. These are all discussed in detail with the patient.  - I have re-counseled the patient on diet management and weight loss  by adopting a carbohydrate restricted / protein rich  Diet. - Patient is advised to stick to a routine mealtimes to eat 3 meals  a day and avoid unnecessary snacks ( to snack only to correct hypoglycemia).   - Suggestion is made for patient to avoid simple carbohydrates   from their diet including Cakes , Desserts, Ice Cream,  Soda (  diet and regular) , Sweet Tea , Candies,  Chips, Cookies, Artificial Sweeteners,   and "Sugar-free" Products .  This will help patient to have stable blood glucose profile and potentially avoid unintended  Weight gain.  - I have approached patient with the following individualized plan to manage diabetes and patient agrees.  - Based on his blood glucose readings, he would not need prandial insulin. -I advised him to continue  Basaglar  20 units qhs, associated with strict monitoring of BG  BEFORE BREAKFAST .   -Patient is encouraged to call clinic for blood glucose levels less than 70 or above 300 mg /dl.  -Continue Victoza 1.8 mg daily. -I will   discontinue Januvia.   -Patient is encouraged to call clinic for blood glucose levels less than 70 or above 300 mg /dl.  -Patient is not a candidate for metformin andSGLT2 inhibitors due to CKD. -He is advised to maintain close follow up  with his nephrologist. His renal function is seen to be improving.   - Patient specific target  for A1c; LDL, HDL, Triglycerides, and  Waist Circumference were discussed in detail.  2) BP/HTN: Controlled. Continue current medications including ARB. 3) Lipids/HPL: continue statins. 4)  Weight/Diet:  exercise, and carbohydrates information provided.  5)  Primary hypothyroidism His labs are suggestive of a slight over replacement, however I would continue on current dose of levothyroxine at 250 g by mouth every morning. His TSH is slightly suppressed along  with appropriate free T4.  I will obtain free T3 along with TSH and free T4 next visit.   - We discussed about correct intake of levothyroxine, at fasting, with water, separated by at least 30 minutes from breakfast, and separated by more than 4 hours from calcium, iron, multivitamins, acid reflux medications (PPIs). -Patient is made aware of the fact that thyroid hormone replacement is needed for life, dose to be adjusted by periodic monitoring of thyroid function tests.  6) Vitamin D deficiency -His vitamin D level is  even lower at 9. I advised him to increase vitamin D 50,000 units twice weekly.   7) Chronic Care/Health Maintenance:  -Patient  on ARBI and Statin medications and encouraged to continue to follow up with Ophthalmology, Podiatrist at least yearly or according to recommendations, and advised to  quit smoking. I have recommended yearly flu vaccine and pneumonia vaccination at least every 5 years; moderate intensity exercise for up to 150 minutes weekly; and  sleep for at least 7 hours a day. -I advised him to wear compression sleeve for bilateral pretibial edema. I advised patient to maintain close follow up with their PCP for primary care needs.  Patient is asked to bring meter and  blood glucose logs during their next visit.   Follow up plan: Return in about 3 months (around 11/08/2015) for diabetes, high blood pressure, high  cholesterol, underactive thyroid, follow up with pre-visit labs, meter, and logs.  Glade Lloyd, MD Phone: [phone]  Fax: [phone]   08/08/2015, 11:54 AM

## 2015-08-09 ENCOUNTER — Other Ambulatory Visit: Payer: Self-pay | Admitting: "Endocrinology

## 2015-09-26 ENCOUNTER — Other Ambulatory Visit: Payer: Self-pay | Admitting: "Endocrinology

## 2015-10-22 ENCOUNTER — Other Ambulatory Visit: Payer: Self-pay | Admitting: "Endocrinology

## 2015-11-09 ENCOUNTER — Other Ambulatory Visit: Payer: Self-pay | Admitting: "Endocrinology

## 2015-11-09 LAB — COMPLETE METABOLIC PANEL WITH GFR
ALBUMIN: 3.1 g/dL — AB (ref 3.6–5.1)
ALK PHOS: 64 U/L (ref 40–115)
ALT: 15 U/L (ref 9–46)
AST: 13 U/L (ref 10–35)
BILIRUBIN TOTAL: 0.2 mg/dL (ref 0.2–1.2)
BUN: 43 mg/dL — AB (ref 7–25)
CALCIUM: 9 mg/dL (ref 8.6–10.3)
CO2: 16 mmol/L — ABNORMAL LOW (ref 20–31)
Chloride: 111 mmol/L — ABNORMAL HIGH (ref 98–110)
Creat: 3.17 mg/dL — ABNORMAL HIGH (ref 0.70–1.25)
GFR, Est African American: 23 mL/min — ABNORMAL LOW (ref >=60)
GFR, Est Non African American: 20 mL/min — ABNORMAL LOW (ref >=60)
Glucose, Bld: 107 mg/dL — ABNORMAL HIGH (ref 65–99)
POTASSIUM: 4.6 mmol/L (ref 3.5–5.3)
Sodium: 138 mmol/L (ref 135–146)
TOTAL PROTEIN: 5.3 g/dL — AB (ref 6.1–8.1)

## 2015-11-09 LAB — T3, FREE: T3, Free: 2.2 pg/mL — ABNORMAL LOW (ref 2.3–4.2)

## 2015-11-09 LAB — T4, FREE: Free T4: 1.5 ng/dL (ref 0.8–1.8)

## 2015-11-09 LAB — TSH: TSH: 0.1 m[IU]/L — AB (ref 0.40–4.50)

## 2015-11-09 LAB — HEMOGLOBIN A1C
HEMOGLOBIN A1C: 6.6 % — AB (ref <5.7)
Mean Plasma Glucose: 143 mg/dL

## 2015-11-23 ENCOUNTER — Encounter: Payer: Self-pay | Admitting: "Endocrinology

## 2015-11-23 ENCOUNTER — Ambulatory Visit (INDEPENDENT_AMBULATORY_CARE_PROVIDER_SITE_OTHER): Payer: Federal, State, Local not specified - PPO | Admitting: "Endocrinology

## 2015-11-23 VITALS — BP 141/71 | HR 76 | Ht 72.0 in | Wt 303.0 lb

## 2015-11-23 DIAGNOSIS — E1159 Type 2 diabetes mellitus with other circulatory complications: Secondary | ICD-10-CM

## 2015-11-23 DIAGNOSIS — N184 Chronic kidney disease, stage 4 (severe): Secondary | ICD-10-CM | POA: Diagnosis not present

## 2015-11-23 DIAGNOSIS — E1122 Type 2 diabetes mellitus with diabetic chronic kidney disease: Secondary | ICD-10-CM | POA: Diagnosis not present

## 2015-11-23 DIAGNOSIS — E785 Hyperlipidemia, unspecified: Secondary | ICD-10-CM

## 2015-11-23 DIAGNOSIS — E039 Hypothyroidism, unspecified: Secondary | ICD-10-CM

## 2015-11-23 DIAGNOSIS — E559 Vitamin D deficiency, unspecified: Secondary | ICD-10-CM | POA: Diagnosis not present

## 2015-11-23 DIAGNOSIS — I10 Essential (primary) hypertension: Secondary | ICD-10-CM

## 2015-11-23 MED ORDER — LIRAGLUTIDE 18 MG/3ML ~~LOC~~ SOPN: 1.8000 mg | PEN_INJECTOR | Freq: Every day | SUBCUTANEOUS | 1 refills | Status: DC

## 2015-11-23 MED ORDER — FUROSEMIDE 20 MG PO TABS: 20.0000 mg | ORAL_TABLET | Freq: Two times a day (BID) | ORAL | 3 refills | Status: DC

## 2015-11-23 MED ORDER — TRUEPLUS LANCETS 28G MISC: 0 refills | Status: DC

## 2015-11-23 MED ORDER — INSULIN GLARGINE 100 UNIT/ML SOLOSTAR PEN: 20.0000 [IU] | PEN_INJECTOR | Freq: Every day | SUBCUTANEOUS | 2 refills | Status: DC

## 2015-11-23 MED ORDER — LIRAGLUTIDE 18 MG/3ML ~~LOC~~ SOPN: 1.8000 mg | PEN_INJECTOR | Freq: Every day | SUBCUTANEOUS | 2 refills | Status: DC

## 2015-11-23 NOTE — Patient Instructions (Signed)

## 2015-11-23 NOTE — Progress Notes (Signed)
Subjective:    Patient ID: Cory Steele, male    DOB: 06/07/1950,    Past Medical History:  Diagnosis Date  . CAD (coronary artery disease)   . Diabetes mellitus, type II ([place])   . Hyperlipidemia   . Hypertension   . Kidney disease   . Obesity   . Thyroid disease    Past Surgical History:  Procedure Laterality Date  . CORONARY ARTERY BYPASS GRAFT     Social History   Social History  . Marital status: Married    Spouse name: N/A  . Number of children: N/A  . Years of education: N/A   Social History Main Topics  . Smoking status: Current Every Day Smoker  . Smokeless tobacco: Never Used  . Alcohol use No  . Drug use: No  . Sexual activity: Not Asked   Other Topics Concern  . None   Social History Narrative  . None   Outpatient Encounter Prescriptions as of 11/23/2015  Medication Sig  . amLODipine (NORVASC) 5 MG tablet Take 1 tablet (5 mg total) by mouth daily.  Marland Kitchen aspirin 325 MG tablet Take 325 mg by mouth daily.  . B Complex Vitamins (VITAMIN-B COMPLEX PO) Take by mouth.  . B-D ULTRAFINE III SHORT PEN 31G X 8 MM MISC USE TWICE A DAY FOR INJECTING INSULIN  . cholecalciferol (VITAMIN D) 1000 UNITS tablet Take 1,000 Units by mouth daily.  . furosemide (LASIX) 20 MG tablet Take 1 tablet (20 mg total) by mouth 2 (two) times daily.  Marland Kitchen gemfibrozil (LOPID) 600 MG tablet Take 600 mg by mouth daily.  . Insulin Glargine (LANTUS) 100 UNIT/ML Solostar Pen Inject 20 Units into the skin daily at 10 pm.  . levothyroxine (SYNTHROID, LEVOTHROID) 125 MCG tablet Take 2 tablets (250 mcg total) by mouth daily before breakfast.  . Liraglutide (VICTOZA) 18 MG/3ML SOPN Inject 0.3 mLs (1.8 mg total) into the skin daily.  Marland Kitchen losartan (COZAAR) 100 MG tablet Take 100 mg by mouth daily.  . metoprolol (LOPRESSOR) 50 MG tablet TAKE (1) TABLET TWICE A DAY.  . pravastatin (PRAVACHOL) 40 MG tablet Take 40 mg by mouth daily.  . TRUE METRIX BLOOD GLUCOSE TEST test strip USE TO TEST BLOOD SUGAR 4  TIMES A DAY  . TRUEPLUS LANCETS 28G MISC USE AS DIRECTED 2 TIMES A DAY  . Vitamin D, Ergocalciferol, (DRISDOL) 50000 units CAPS capsule Take 1 capsule (50,000 Units total) by mouth 2 (two) times a week.  . [DISCONTINUED] furosemide (LASIX) 20 MG tablet Take 1 tablet (20 mg total) by mouth daily.  . [DISCONTINUED] Insulin Glargine (BASAGLAR KWIKPEN) 100 UNIT/ML Solostar Pen Inject 20 Units into the skin daily at 10 pm.  . [DISCONTINUED] Liraglutide (VICTOZA) 18 MG/3ML SOPN Inject 0.3 mLs (1.8 mg total) into the skin daily.  . [DISCONTINUED] Liraglutide (VICTOZA) 18 MG/3ML SOPN Inject 0.3 mLs (1.8 mg total) into the skin daily.  . [DISCONTINUED] TRUEPLUS LANCETS 28G MISC USE AS DIRECTED 4 TIMES A DAY   No facility-administered encounter medications on file as of 11/23/2015.    ALLERGIES: Allergies  Allergen Reactions  . Codeine   . Vancomycin    VACCINATION STATUS:  There is no immunization history on file for this patient.  Diabetes  He presents for his follow-up diabetic visit. He has type 2 diabetes mellitus. Onset time: He was diagnosed at approximate age of 63 years. His disease course has been improving. There are no hypoglycemic associated symptoms. Pertinent negatives for hypoglycemia include  no confusion, headaches, nervousness/anxiousness, pallor or seizures. Pertinent negatives for diabetes include no chest pain, no fatigue, no polydipsia, no polyphagia, no polyuria and no weakness. There are no hypoglycemic complications. Symptoms are improving. Diabetic complications include heart disease, nephropathy, peripheral neuropathy and PVD. Risk factors for coronary artery disease include diabetes mellitus, dyslipidemia, male sex, obesity and tobacco exposure. He is compliant with treatment most of the time. His weight is increasing steadily. He is following a generally unhealthy diet. He rarely participates in exercise. His breakfast blood glucose range is generally 110-130 mg/dl. (He only  monitors blood glucose before breakfast.) An ACE inhibitor/angiotensin II receptor blocker is being taken. Eye exam is current.  Hyperlipidemia  This is a chronic problem. The current episode started more than 1 year ago. Pertinent negatives include no chest pain, myalgias or shortness of breath. Current antihyperlipidemic treatment includes statins. Risk factors for coronary artery disease include dyslipidemia, diabetes mellitus, hypertension, male sex and obesity.  Hypertension  Pertinent negatives include no chest pain, headaches, neck pain, palpitations or shortness of breath. Hypertensive end-organ damage includes PVD and a thyroid problem.  Thyroid Problem  Presents for follow-up visit. Patient reports no anxiety, constipation, diarrhea, fatigue or palpitations. The symptoms have been stable. Past treatments include levothyroxine. His past medical history is significant for hyperlipidemia.     Review of Systems  Constitutional: Negative for fatigue and unexpected weight change.  HENT: Negative for dental problem, mouth sores and trouble swallowing.   Eyes: Negative for visual disturbance.  Respiratory: Negative for cough, choking, chest tightness, shortness of breath and wheezing.   Cardiovascular: Negative for chest pain, palpitations and leg swelling.  Gastrointestinal: Negative for abdominal distention, abdominal pain, constipation, diarrhea, nausea and vomiting.  Endocrine: Negative for polydipsia, polyphagia and polyuria.  Genitourinary: Negative for dysuria, flank pain, hematuria and urgency.  Musculoskeletal: Negative for back pain, gait problem, myalgias and neck pain.  Skin: Negative for pallor, rash and wound.  Neurological: Negative for seizures, syncope, weakness, numbness and headaches.  Psychiatric/Behavioral: Negative.  Negative for confusion and dysphoric mood. The patient is not nervous/anxious.     Objective:    BP (!) 141/71   Pulse 76   Ht 6' (1.829 m)   Wt (!)  303 lb (137.4 kg)   BMI 41.09 kg/m   Wt Readings from Last 3 Encounters:  11/23/15 (!) 303 lb (137.4 kg)  08/08/15 300 lb (136.1 kg)  05/11/15 292 lb (132.5 kg)    Physical Exam  Constitutional: He is oriented to person, place, and time. He appears well-developed and well-nourished. He is cooperative. No distress.  HENT:  Head: Normocephalic and atraumatic.  Eyes: EOM are normal.  Neck: Normal range of motion. Neck supple. No tracheal deviation present. No thyromegaly present.  Cardiovascular: Normal rate, S1 normal, S2 normal and normal heart sounds.  Exam reveals no gallop.   No murmur heard. Pulses:      Dorsalis pedis pulses are 1+ on the right side, and 1+ on the left side.       Posterior tibial pulses are 1+ on the right side, and 1+ on the left side.  Pulmonary/Chest: Breath sounds normal. No respiratory distress. He has no wheezes.  Abdominal: Soft. Bowel sounds are normal. He exhibits no distension. There is no tenderness. There is no guarding and no CVA tenderness.  Musculoskeletal: He exhibits no edema.       Right shoulder: He exhibits no swelling and no deformity.  Neurological: He is alert and oriented to  person, place, and time. He has normal strength and normal reflexes. No cranial nerve deficit or sensory deficit. Gait normal.  Skin: Skin is warm and dry. No rash noted. No cyanosis. Nails show no clubbing.  Psychiatric: He has a normal mood and affect. His speech is normal and behavior is normal. Judgment and thought content normal. Cognition and memory are normal.    Results for orders placed or performed in visit on 11/09/15  COMPLETE METABOLIC PANEL WITH GFR  Result Value Ref Range   Sodium 138 135 - 146 mmol/L   Potassium 4.6 3.5 - 5.3 mmol/L   Chloride 111 (H) 98 - 110 mmol/L   CO2 16 (L) 20 - 31 mmol/L   Glucose, Bld 107 (H) 65 - 99 mg/dL   BUN 43 (H) 7 - 25 mg/dL   Creat 3.17 (H) 0.70 - 1.25 mg/dL   Total Bilirubin 0.2 0.2 - 1.2 mg/dL   Alkaline  Phosphatase 64 40 - 115 U/L   AST 13 10 - 35 U/L   ALT 15 9 - 46 U/L   Total Protein 5.3 (L) 6.1 - 8.1 g/dL   Albumin 3.1 (L) 3.6 - 5.1 g/dL   Calcium 9.0 8.6 - 10.3 mg/dL   GFR, Est African American 23 (L) >=60 mL/min   GFR, Est Non African American 20 (L) >=60 mL/min  TSH  Result Value Ref Range   TSH 0.10 (L) 0.40 - 4.50 mIU/L  T4, free  Result Value Ref Range   Free T4 1.5 0.8 - 1.8 ng/dL  T3, free  Result Value Ref Range   T3, Free 2.2 (L) 2.3 - 4.2 pg/mL  Hemoglobin A1c  Result Value Ref Range   Hgb A1c MFr Bld 6.6 (H) <5.7 %   Mean Plasma Glucose 143 mg/dL       Diabetic Labs (most recent): Lab Results  Component Value Date   HGBA1C 6.6 (H) 11/09/2015   HGBA1C 7.4 (H) 08/01/2015   HGBA1C 9.7 04/17/2015      Assessment & Plan:   1. 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  Patient came with elevated glucose profile even at fasting, and  recent A1c  6.6 % , generally improving from  9.7% .  -His recent logs show near target blood glucose profile.  Glucose logs and insulin administration records pertaining to this visit,  to be scanned into patient's records.  Recent labs reviewed. - Patient remains at a high risk for more acute and chronic complications of diabetes which include CAD, CVA, CKD, retinopathy, and neuropathy. These are all discussed in detail with the patient.  - I have re-counseled the patient on diet management and weight loss  by adopting a carbohydrate restricted / protein rich  Diet. - Patient is advised to stick to a routine mealtimes to eat 3 meals  a day and avoid unnecessary snacks ( to snack only to correct hypoglycemia).   - Suggestion is made for patient to avoid simple carbohydrates   from their diet including Cakes , Desserts, Ice Cream,  Soda (  diet and regular) , Sweet Tea ,  Candies,  Chips, Cookies, Artificial Sweeteners,   and "Sugar-free" Products .  This will help patient to have stable blood glucose profile and potentially avoid unintended  Weight gain.  - I have approached patient with the following individualized plan to manage  diabetes and patient agrees.  -I advised him to continue  Basaglar  20 units qhs, associated with strict monitoring of BG  BEFORE BREAKFAST .   -Patient is encouraged to call clinic for blood glucose levels less than 70 or above 300 mg /dl.  -Continue Victoza 1.8 mg daily.   -Patient is encouraged to call clinic for blood glucose levels less than 70 or above 300 mg /dl.  -Patient is not a candidate for metformin andSGLT2 inhibitors due to CKD. -He is advised to maintain close follow up with his nephrologist. His renal function is seen to be improving.  - Due to some trace pretibial /bilateral feet edema, I advised him to increase his furosemide 20 mg and 40 mg alternate days. He has seen better results with this kind of arrangement  in the past.  - Patient specific target  for A1c; LDL, HDL, Triglycerides, and  Waist Circumference were discussed in detail.  2) BP/HTN: Controlled. Continue current medications including ARB. 3) Lipids/HPL: continue statins. 4)  Weight/Diet:  exercise, and carbohydrates information provided.  5)  Primary hypothyroidism His labs are suggestive of a slight over replacement, however I would continue on current dose of levothyroxine at 250 g by mouth every morning. His TSH is slightly suppressed along with appropriate free T4, and appropriate free T3.   - We discussed about correct intake of levothyroxine, at fasting, with water, separated by at least 30 minutes from breakfast, and separated by more than 4 hours from calcium, iron, multivitamins, acid reflux medications (PPIs). -Patient is made aware of the fact that thyroid hormone replacement is needed for life, dose to be adjusted by periodic  monitoring of thyroid function tests.  6) Vitamin D deficiency -His vitamin D level is  even lower at 9. I advised him to increase vitamin D 50,000 units twice weekly.   7) Chronic Care/Health Maintenance:  -Patient  on ARBI and Statin medications and encouraged to continue to follow up with Ophthalmology, Podiatrist at least yearly or according to recommendations, and advised to  quit smoking. I have recommended yearly flu vaccine and pneumonia vaccination at least every 5 years; moderate intensity exercise for up to 150 minutes weekly; and  sleep for at least 7 hours a day. -I advised him to wear compression sleeve for bilateral pretibial edema. I advised patient to maintain close follow up with their PCP for primary care needs.  Patient is asked to bring meter and  blood glucose logs during their next visit.   Follow up plan: Return in about 3 months (around 02/22/2016) for follow up with pre-visit labs, meter, and logs.  Glade Lloyd, MD Phone: [phone]  Fax: [phone]   11/23/2015, 2:04 PM

## 2016-02-27 ENCOUNTER — Other Ambulatory Visit: Payer: Self-pay | Admitting: "Endocrinology

## 2016-02-27 LAB — COMPLETE METABOLIC PANEL WITH GFR
ALT: 16 U/L (ref 9–46)
AST: 15 U/L (ref 10–35)
Albumin: 3.1 g/dL — ABNORMAL LOW (ref 3.6–5.1)
Alkaline Phosphatase: 57 U/L (ref 40–115)
BILIRUBIN TOTAL: 0.2 mg/dL (ref 0.2–1.2)
BUN: 52 mg/dL — AB (ref 7–25)
CALCIUM: 8.4 mg/dL — AB (ref 8.6–10.3)
CHLORIDE: 110 mmol/L (ref 98–110)
CO2: 16 mmol/L — AB (ref 20–31)
CREATININE: 3.19 mg/dL — AB (ref 0.70–1.25)
GFR, Est African American: 23 mL/min — ABNORMAL LOW (ref >=60)
GFR, Est Non African American: 20 mL/min — ABNORMAL LOW (ref >=60)
Glucose, Bld: 90 mg/dL (ref 65–99)
Potassium: 4.5 mmol/L (ref 3.5–5.3)
Sodium: 137 mmol/L (ref 135–146)
TOTAL PROTEIN: 5.2 g/dL — AB (ref 6.1–8.1)

## 2016-02-28 LAB — HEMOGLOBIN A1C
HEMOGLOBIN A1C: 5.9 % — AB (ref <5.7)
Mean Plasma Glucose: 123 mg/dL

## 2016-02-28 LAB — VITAMIN D 25 HYDROXY (VIT D DEFICIENCY, FRACTURES): Vit D, 25-Hydroxy: 19 ng/mL — ABNORMAL LOW (ref 30–100)

## 2016-03-07 ENCOUNTER — Ambulatory Visit (INDEPENDENT_AMBULATORY_CARE_PROVIDER_SITE_OTHER): Payer: Federal, State, Local not specified - PPO | Admitting: "Endocrinology

## 2016-03-07 ENCOUNTER — Encounter: Payer: Self-pay | Admitting: "Endocrinology

## 2016-03-07 VITALS — BP 135/64 | HR 81 | Ht 72.0 in | Wt 295.0 lb

## 2016-03-07 DIAGNOSIS — E1122 Type 2 diabetes mellitus with diabetic chronic kidney disease: Secondary | ICD-10-CM | POA: Diagnosis not present

## 2016-03-07 DIAGNOSIS — E559 Vitamin D deficiency, unspecified: Secondary | ICD-10-CM

## 2016-03-07 DIAGNOSIS — E782 Mixed hyperlipidemia: Secondary | ICD-10-CM | POA: Diagnosis not present

## 2016-03-07 DIAGNOSIS — E039 Hypothyroidism, unspecified: Secondary | ICD-10-CM | POA: Diagnosis not present

## 2016-03-07 DIAGNOSIS — I10 Essential (primary) hypertension: Secondary | ICD-10-CM | POA: Diagnosis not present

## 2016-03-07 DIAGNOSIS — N184 Chronic kidney disease, stage 4 (severe): Secondary | ICD-10-CM | POA: Diagnosis not present

## 2016-03-07 DIAGNOSIS — E1159 Type 2 diabetes mellitus with other circulatory complications: Secondary | ICD-10-CM | POA: Diagnosis not present

## 2016-03-07 MED ORDER — BASAGLAR KWIKPEN 100 UNIT/ML ~~LOC~~ SOPN: 20.0000 [IU] | PEN_INJECTOR | Freq: Every day | SUBCUTANEOUS | 2 refills | Status: DC

## 2016-03-07 MED ORDER — VITAMIN D3 125 MCG (5000 UT) PO CAPS: 5000.0000 [IU] | ORAL_CAPSULE | Freq: Every day | ORAL | 0 refills | Status: DC

## 2016-03-07 MED ORDER — LEVOTHYROXINE SODIUM 125 MCG PO TABS: 250.0000 ug | ORAL_TABLET | Freq: Every day | ORAL | 5 refills | Status: DC

## 2016-03-07 MED ORDER — LIRAGLUTIDE 18 MG/3ML ~~LOC~~ SOPN: 1.8000 mg | PEN_INJECTOR | Freq: Every day | SUBCUTANEOUS | 1 refills | Status: DC

## 2016-03-07 NOTE — Progress Notes (Signed)
Subjective:    Patient ID: Cory Steele, male    DOB: December 20, 1950,    Past Medical History:  Diagnosis Date  . CAD (coronary artery disease)   . Diabetes mellitus, type II ([place])   . Hyperlipidemia   . Hypertension   . Kidney disease   . Obesity   . Thyroid disease    Past Surgical History:  Procedure Laterality Date  . CORONARY ARTERY BYPASS GRAFT     Social History   Social History  . Marital status: Married    Spouse name: N/A  . Number of children: N/A  . Years of education: N/A   Social History Main Topics  . Smoking status: Current Every Day Smoker  . Smokeless tobacco: Never Used  . Alcohol use No  . Drug use: No  . Sexual activity: Not on file   Other Topics Concern  . Not on file   Social History Narrative  . No narrative on file   Outpatient Encounter Prescriptions as of 03/07/2016  Medication Sig  . amLODipine (NORVASC) 5 MG tablet Take 1 tablet (5 mg total) by mouth daily.  Marland Kitchen aspirin 325 MG tablet Take 325 mg by mouth daily.  . B Complex Vitamins (VITAMIN-B COMPLEX PO) Take by mouth.  . B-D ULTRAFINE III SHORT PEN 31G X 8 MM MISC USE TWICE A DAY FOR INJECTING INSULIN  . cholecalciferol (VITAMIN D) 1000 UNITS tablet Take 1,000 Units by mouth daily.  . Cholecalciferol (VITAMIN D3) 5000 units CAPS Take 1 capsule (5,000 Units total) by mouth daily.  . furosemide (LASIX) 20 MG tablet Take 1 tablet (20 mg total) by mouth 2 (two) times daily.  Marland Kitchen gemfibrozil (LOPID) 600 MG tablet Take 600 mg by mouth daily.  . Insulin Glargine (BASAGLAR KWIKPEN) 100 UNIT/ML SOPN Inject 0.2 mLs (20 Units total) into the skin at bedtime.  Marland Kitchen levothyroxine (SYNTHROID, LEVOTHROID) 125 MCG tablet Take 2 tablets (250 mcg total) by mouth daily before breakfast.  . liraglutide (VICTOZA) 18 MG/3ML SOPN Inject 0.3 mLs (1.8 mg total) into the skin daily.  Marland Kitchen losartan (COZAAR) 100 MG tablet Take 100 mg by mouth daily.  . metoprolol (LOPRESSOR) 50 MG tablet TAKE (1) TABLET TWICE A DAY.   . pravastatin (PRAVACHOL) 40 MG tablet Take 40 mg by mouth daily.  . TRUE METRIX BLOOD GLUCOSE TEST test strip USE TO TEST BLOOD SUGAR 4 TIMES A DAY  . TRUEPLUS LANCETS 28G MISC USE AS DIRECTED 2 TIMES A DAY  . Vitamin D, Ergocalciferol, (DRISDOL) 50000 units CAPS capsule Take 1 capsule (50,000 Units total) by mouth 2 (two) times a week.  . [DISCONTINUED] Cholecalciferol (VITAMIN D3) 5000 units CAPS Take 1 capsule (5,000 Units total) by mouth daily.  . [DISCONTINUED] Insulin Glargine (LANTUS) 100 UNIT/ML Solostar Pen Inject 20 Units into the skin daily at 10 pm.  . [DISCONTINUED] levothyroxine (SYNTHROID, LEVOTHROID) 125 MCG tablet Take 2 tablets (250 mcg total) by mouth daily before breakfast.  . [DISCONTINUED] Liraglutide (VICTOZA) 18 MG/3ML SOPN Inject 0.3 mLs (1.8 mg total) into the skin daily.   No facility-administered encounter medications on file as of 03/07/2016.    ALLERGIES: Allergies  Allergen Reactions  . Codeine   . Vancomycin    VACCINATION STATUS:  There is no immunization history on file for this patient.  Diabetes  He presents for his follow-up diabetic visit. He has type 2 diabetes mellitus. Onset time: He was diagnosed at approximate age of 68 years. His disease course has been  improving. There are no hypoglycemic associated symptoms. Pertinent negatives for hypoglycemia include no confusion, headaches, nervousness/anxiousness, pallor or seizures. Pertinent negatives for diabetes include no chest pain, no fatigue, no polydipsia, no polyphagia, no polyuria and no weakness. There are no hypoglycemic complications. Symptoms are improving. Diabetic complications include heart disease, nephropathy, peripheral neuropathy and PVD. Risk factors for coronary artery disease include diabetes mellitus, dyslipidemia, male sex, obesity and tobacco exposure. He is compliant with treatment most of the time. His weight is decreasing steadily. He is following a generally unhealthy diet. He  rarely participates in exercise. His breakfast blood glucose range is generally 110-130 mg/dl. (He only monitors blood glucose before breakfast.) An ACE inhibitor/angiotensin II receptor blocker is being taken. Eye exam is current.  Hyperlipidemia  This is a chronic problem. The current episode started more than 1 year ago. Pertinent negatives include no chest pain, myalgias or shortness of breath. Current antihyperlipidemic treatment includes statins. Risk factors for coronary artery disease include dyslipidemia, diabetes mellitus, hypertension, male sex and obesity.  Hypertension  Pertinent negatives include no chest pain, headaches, neck pain, palpitations or shortness of breath. Hypertensive end-organ damage includes PVD and a thyroid problem.  Thyroid Problem  Presents for follow-up visit. Patient reports no anxiety, constipation, diarrhea, fatigue or palpitations. The symptoms have been stable. Past treatments include levothyroxine. His past medical history is significant for hyperlipidemia.     Review of Systems  Constitutional: Negative for fatigue and unexpected weight change.  HENT: Negative for dental problem, mouth sores and trouble swallowing.   Eyes: Negative for visual disturbance.  Respiratory: Negative for cough, choking, chest tightness, shortness of breath and wheezing.   Cardiovascular: Negative for chest pain, palpitations and leg swelling.  Gastrointestinal: Negative for abdominal distention, abdominal pain, constipation, diarrhea, nausea and vomiting.  Endocrine: Negative for polydipsia, polyphagia and polyuria.  Genitourinary: Negative for dysuria, flank pain, hematuria and urgency.  Musculoskeletal: Negative for back pain, gait problem, myalgias and neck pain.  Skin: Negative for pallor, rash and wound.  Neurological: Negative for seizures, syncope, weakness, numbness and headaches.  Psychiatric/Behavioral: Negative.  Negative for confusion and dysphoric mood. The  patient is not nervous/anxious.     Objective:    BP 135/64   Pulse 81   Ht 6' (1.829 m)   Wt 295 lb (133.8 kg)   BMI 40.01 kg/m   Wt Readings from Last 3 Encounters:  03/07/16 295 lb (133.8 kg)  11/23/15 (!) 303 lb (137.4 kg)  08/08/15 300 lb (136.1 kg)    Physical Exam  Constitutional: He is oriented to person, place, and time. He appears well-developed and well-nourished. He is cooperative. No distress.  HENT:  Head: Normocephalic and atraumatic.  Eyes: EOM are normal.  Neck: Normal range of motion. Neck supple. No tracheal deviation present. No thyromegaly present.  Cardiovascular: Normal rate, S1 normal, S2 normal and normal heart sounds.  Exam reveals no gallop.   No murmur heard. Pulses:      Dorsalis pedis pulses are 1+ on the right side, and 1+ on the left side.       Posterior tibial pulses are 1+ on the right side, and 1+ on the left side.  Pulmonary/Chest: Breath sounds normal. No respiratory distress. He has no wheezes.  Abdominal: Soft. Bowel sounds are normal. He exhibits no distension. There is no tenderness. There is no guarding and no CVA tenderness.  Musculoskeletal: He exhibits no edema.       Right shoulder: He exhibits no swelling and  no deformity.  Neurological: He is alert and oriented to person, place, and time. He has normal strength and normal reflexes. No cranial nerve deficit or sensory deficit. Gait normal.  Skin: Skin is warm and dry. No rash noted. No cyanosis. Nails show no clubbing.  Psychiatric: He has a normal mood and affect. His speech is normal and behavior is normal. Judgment and thought content normal. Cognition and memory are normal.    Results for orders placed or performed in visit on 02/27/16  COMPLETE METABOLIC PANEL WITH GFR  Result Value Ref Range   Sodium 137 135 - 146 mmol/L   Potassium 4.5 3.5 - 5.3 mmol/L   Chloride 110 98 - 110 mmol/L   CO2 16 (L) 20 - 31 mmol/L   Glucose, Bld 90 65 - 99 mg/dL   BUN 52 (H) 7 - 25 mg/dL    Creat 9.05 (H) 0.25 - 1.25 mg/dL   Total Bilirubin 0.2 0.2 - 1.2 mg/dL   Alkaline Phosphatase 57 40 - 115 U/L   AST 15 10 - 35 U/L   ALT 16 9 - 46 U/L   Total Protein 5.2 (L) 6.1 - 8.1 g/dL   Albumin 3.1 (L) 3.6 - 5.1 g/dL   Calcium 8.4 (L) 8.6 - 10.3 mg/dL   GFR, Est African American 23 (L) >=60 mL/min   GFR, Est Non African American 20 (L) >=60 mL/min  Hemoglobin A1c  Result Value Ref Range   Hgb A1c MFr Bld 5.9 (H) <5.7 %   Mean Plasma Glucose 123 mg/dL  VITAMIN D 25 Hydroxy (Vit-D Deficiency, Fractures)  Result Value Ref Range   Vit D, 25-Hydroxy 19 (L) 30 - 100 ng/mL       Diabetic Labs (most recent): Lab Results  Component Value Date   HGBA1C 5.9 (H) 02/27/2016   HGBA1C 6.6 (H) 11/09/2015   HGBA1C 7.4 (H) 08/01/2015      Assessment & Plan:   1. 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  Patient came with elevated glucose profile even at fasting, and  recent A1c  5.9% , generally improving from  9.7% .  -His recent logs show near target blood glucose profile.  Glucose logs and insulin administration records pertaining to this visit,  to be scanned into patient's records.  Recent labs reviewed. - Patient remains at a high risk for more acute and chronic complications of diabetes which include CAD, CVA, CKD, retinopathy, and neuropathy. These are all discussed in detail with the patient.  - I have re-counseled the patient on diet management and weight loss  by adopting a carbohydrate restricted / protein rich  Diet. - Patient is advised to stick to a routine mealtimes to eat 3 meals  a day and avoid unnecessary snacks ( to snack only to correct hypoglycemia).   - Suggestion is made for patient to avoid simple carbohydrates   from their diet including Cakes , Desserts, Ice Cream,  Soda (  diet and regular) ,  Sweet Tea , Candies,  Chips, Cookies, Artificial Sweeteners,   and "Sugar-free" Products .  This will help patient to have stable blood glucose profile and potentially avoid unintended  Weight gain.  - I have approached patient with the following individualized plan to manage diabetes and patient agrees.  -I advised him to continue  Basaglar  10 units qhs, associated with  strict monitoring of BG  BEFORE BREAKFAST .   -Patient is encouraged to call clinic for blood glucose levels less than 70 or above 300 mg /dl.  -Continue Victoza 1.8 mg daily.   -Patient is encouraged to call clinic for blood glucose levels less than 70 or above 300 mg /dl.  -Patient is not a candidate for metformin andSGLT2 inhibitors due to CKD. -He is advised to maintain close follow up with his nephrologist. His renal function is seen to be improving.  - Due to some trace pretibial /bilateral feet edema, I advised him to increase his furosemide 20 mg and 40 mg alternate days. He has seen better results with this kind of arrangement  in the past.  - Patient specific target  for A1c; LDL, HDL, Triglycerides, and  Waist Circumference were discussed in detail.  2) BP/HTN: Controlled. Continue current medications including ARB. 3) Lipids/HPL: continue statins. 4)  Weight/Diet:  exercise, and carbohydrates information provided.  5)  Primary hypothyroidism His labs are suggestive of a slight over replacement, however I would continue on current dose of levothyroxine at 250 g by mouth every morning. His TSH is slightly suppressed along with appropriate free T4, and appropriate free T3.   - We discussed about correct intake of levothyroxine, at fasting, with water, separated by at least 30 minutes from breakfast, and separated by more than 4 hours from calcium, iron, multivitamins, acid reflux medications (PPIs). -Patient is made aware of the fact that thyroid hormone replacement is needed for life, dose to be adjusted by  periodic monitoring of thyroid function tests.  6) Vitamin D deficiency -His vitamin D level is now better at 60, was 9 at last visit. I advised him to increase vitamin D 50,000 units twice weekly, and add vitamin D3 5000 units daily for the next 90 days.  7) Chronic Care/Health Maintenance:  -Patient  on ARBI and Statin medications and encouraged to continue to follow up with Ophthalmology, Podiatrist at least yearly or according to recommendations, and advised to  quit smoking. I have recommended yearly flu vaccine and pneumonia vaccination at least every 5 years; moderate intensity exercise for up to 150 minutes weekly; and  sleep for at least 7 hours a day. -I advised him to wear compression sleeve for bilateral pretibial edema. I advised patient to maintain close follow up with their PCP for primary care needs.  Patient is asked to bring meter and  blood glucose logs during their next visit.   Follow up plan: Return in about 3 months (around 06/05/2016) for meter, and logs.  Glade Lloyd, MD Phone: [phone]  Fax: [phone]   03/07/2016, 11:13 AM

## 2016-03-07 NOTE — Patient Instructions (Signed)

## 2016-05-05 ENCOUNTER — Telehealth: Payer: Self-pay | Admitting: "Endocrinology

## 2016-05-05 NOTE — Telephone Encounter (Signed)
I spoke with patient. Victoza is not the cause of kidney failure , and Victoza is safe even at end-stage renal failure. I advised him to lower the dose to 1.2 mg daily until his next visit.

## 2016-05-05 NOTE — Telephone Encounter (Signed)
Patient states his kidneys are failing and his other doctor told him that the Victoza was not helping his kidney situation. He is requesting to speak with Dr.Nida and to be changed to something else besides Victoza.

## 2016-05-30 ENCOUNTER — Other Ambulatory Visit: Payer: Self-pay | Admitting: "Endocrinology

## 2016-05-30 LAB — COMPREHENSIVE METABOLIC PANEL
ALT: 19 U/L (ref 9–46)
AST: 19 U/L (ref 10–35)
Albumin: 3.3 g/dL — ABNORMAL LOW (ref 3.6–5.1)
Alkaline Phosphatase: 70 U/L (ref 40–115)
BILIRUBIN TOTAL: 0.2 mg/dL (ref 0.2–1.2)
BUN: 62 mg/dL — ABNORMAL HIGH (ref 7–25)
CALCIUM: 8.6 mg/dL (ref 8.6–10.3)
CO2: 21 mmol/L (ref 20–31)
Chloride: 106 mmol/L (ref 98–110)
Creat: 3.86 mg/dL — ABNORMAL HIGH (ref 0.70–1.25)
GLUCOSE: 66 mg/dL (ref 65–99)
Potassium: 4.5 mmol/L (ref 3.5–5.3)
Sodium: 136 mmol/L (ref 135–146)
Total Protein: 5.4 g/dL — ABNORMAL LOW (ref 6.1–8.1)

## 2016-05-30 LAB — TSH: TSH: 0.18 mIU/L — ABNORMAL LOW (ref 0.40–4.50)

## 2016-05-30 LAB — T4, FREE: Free T4: 1.1 ng/dL (ref 0.8–1.8)

## 2016-05-31 LAB — HEMOGLOBIN A1C
HEMOGLOBIN A1C: 6.2 % — AB (ref <5.7)
MEAN PLASMA GLUCOSE: 131 mg/dL

## 2016-06-06 ENCOUNTER — Encounter (INDEPENDENT_AMBULATORY_CARE_PROVIDER_SITE_OTHER): Payer: Self-pay

## 2016-06-06 ENCOUNTER — Ambulatory Visit (INDEPENDENT_AMBULATORY_CARE_PROVIDER_SITE_OTHER): Payer: Federal, State, Local not specified - PPO | Admitting: "Endocrinology

## 2016-06-06 ENCOUNTER — Encounter: Payer: Self-pay | Admitting: "Endocrinology

## 2016-06-06 VITALS — BP 147/70 | HR 76 | Ht 72.0 in | Wt 290.0 lb

## 2016-06-06 DIAGNOSIS — I10 Essential (primary) hypertension: Secondary | ICD-10-CM

## 2016-06-06 DIAGNOSIS — E1122 Type 2 diabetes mellitus with diabetic chronic kidney disease: Secondary | ICD-10-CM | POA: Diagnosis not present

## 2016-06-06 DIAGNOSIS — E039 Hypothyroidism, unspecified: Secondary | ICD-10-CM

## 2016-06-06 DIAGNOSIS — E782 Mixed hyperlipidemia: Secondary | ICD-10-CM

## 2016-06-06 DIAGNOSIS — N184 Chronic kidney disease, stage 4 (severe): Secondary | ICD-10-CM

## 2016-06-06 MED ORDER — VITAMIN D3 1.25 MG (50000 UT) PO TABS: 1.0000 | ORAL_TABLET | ORAL | 4 refills | Status: DC

## 2016-06-06 MED ORDER — VITAMIN D3 125 MCG (5000 UT) PO CAPS: 5000.0000 [IU] | ORAL_CAPSULE | Freq: Every day | ORAL | 0 refills | Status: DC

## 2016-06-06 MED ORDER — LIRAGLUTIDE 18 MG/3ML ~~LOC~~ SOPN: 1.2000 mg | PEN_INJECTOR | Freq: Every day | SUBCUTANEOUS | 1 refills | Status: DC

## 2016-06-06 NOTE — Progress Notes (Signed)
Subjective:    Patient ID: Cory Steele, male    DOB: Apr 01, 1950,    Past Medical History:  Diagnosis Date  . CAD (coronary artery disease)   . Diabetes mellitus, type II ([place])   . Hyperlipidemia   . Hypertension   . Kidney disease   . Obesity   . Thyroid disease    Past Surgical History:  Procedure Laterality Date  . CORONARY ARTERY BYPASS GRAFT     Social History   Social History  . Marital status: Married    Spouse name: N/A  . Number of children: N/A  . Years of education: N/A   Social History Main Topics  . Smoking status: Current Every Day Smoker  . Smokeless tobacco: Never Used  . Alcohol use No  . Drug use: No  . Sexual activity: Not Asked   Other Topics Concern  . None   Social History Narrative  . None   Outpatient Encounter Prescriptions as of 06/06/2016  Medication Sig  . amLODipine (NORVASC) 5 MG tablet Take 1 tablet (5 mg total) by mouth daily.  Marland Kitchen aspirin 325 MG tablet Take 325 mg by mouth daily.  . B Complex Vitamins (VITAMIN-B COMPLEX PO) Take by mouth.  . B-D ULTRAFINE III SHORT PEN 31G X 8 MM MISC USE TWICE A DAY FOR INJECTING INSULIN  . Cholecalciferol (VITAMIN D3) 5000 units CAPS Take 1 capsule (5,000 Units total) by mouth daily.  Derrill Memo ON 06/09/2016] Cholecalciferol (VITAMIN D3) 50000 units TABS Take 1 capsule by mouth 2 (two) times a week.  . furosemide (LASIX) 20 MG tablet Take 1 tablet (20 mg total) by mouth 2 (two) times daily.  Marland Kitchen gemfibrozil (LOPID) 600 MG tablet Take 600 mg by mouth daily.  . Insulin Glargine (BASAGLAR KWIKPEN) 100 UNIT/ML SOPN Inject 0.2 mLs (20 Units total) into the skin at bedtime.  Marland Kitchen levothyroxine (SYNTHROID, LEVOTHROID) 125 MCG tablet Take 2 tablets (250 mcg total) by mouth daily before breakfast.  . liraglutide (VICTOZA) 18 MG/3ML SOPN Inject 0.2 mLs (1.2 mg total) into the skin daily.  Marland Kitchen losartan (COZAAR) 100 MG tablet Take 100 mg by mouth daily.  . metoprolol (LOPRESSOR) 50 MG tablet TAKE (1) TABLET  TWICE A DAY.  . pravastatin (PRAVACHOL) 40 MG tablet Take 40 mg by mouth daily.  . TRUE METRIX BLOOD GLUCOSE TEST test strip USE TO TEST BLOOD SUGAR 4 TIMES A DAY  . TRUEPLUS LANCETS 28G MISC USE AS DIRECTED 2 TIMES A DAY  . [DISCONTINUED] cholecalciferol (VITAMIN D) 1000 UNITS tablet Take 1,000 Units by mouth daily.  . [DISCONTINUED] Cholecalciferol (VITAMIN D3) 5000 units CAPS Take 1 capsule (5,000 Units total) by mouth daily.  . [DISCONTINUED] liraglutide (VICTOZA) 18 MG/3ML SOPN Inject 0.3 mLs (1.8 mg total) into the skin daily.  . [DISCONTINUED] Vitamin D, Ergocalciferol, (DRISDOL) 50000 units CAPS capsule Take 1 capsule (50,000 Units total) by mouth 2 (two) times a week.   No facility-administered encounter medications on file as of 06/06/2016.    ALLERGIES: Allergies  Allergen Reactions  . Codeine   . Vancomycin    VACCINATION STATUS:  There is no immunization history on file for this patient.  Diabetes  He presents for his follow-up diabetic visit. He has type 2 diabetes mellitus. Onset time: He was diagnosed at approximate age of 75 years. His disease course has been stable. There are no hypoglycemic associated symptoms. Pertinent negatives for hypoglycemia include no confusion, headaches, nervousness/anxiousness, pallor or seizures. Pertinent negatives for diabetes  include no chest pain, no fatigue, no polydipsia, no polyphagia, no polyuria and no weakness. There are no hypoglycemic complications. Symptoms are stable. Diabetic complications include heart disease, nephropathy, peripheral neuropathy and PVD. Risk factors for coronary artery disease include diabetes mellitus, dyslipidemia, male sex, obesity and tobacco exposure. He is compliant with treatment most of the time. His weight is decreasing steadily. He is following a generally unhealthy diet. He rarely participates in exercise. His breakfast blood glucose range is generally 110-130 mg/dl. (He only monitors blood glucose before  breakfast.) An ACE inhibitor/angiotensin II receptor blocker is being taken. Eye exam is current.  Hyperlipidemia  This is a chronic problem. The current episode started more than 1 year ago. Pertinent negatives include no chest pain, myalgias or shortness of breath. Current antihyperlipidemic treatment includes statins. Risk factors for coronary artery disease include dyslipidemia, diabetes mellitus, hypertension, male sex and obesity.  Hypertension  Pertinent negatives include no chest pain, headaches, neck pain, palpitations or shortness of breath. Hypertensive end-organ damage includes PVD. Identifiable causes of hypertension include a thyroid problem.  Thyroid Problem  Presents for follow-up visit. Patient reports no anxiety, constipation, diarrhea, fatigue or palpitations. The symptoms have been stable. Past treatments include levothyroxine. His past medical history is significant for hyperlipidemia.    Review of Systems  Constitutional: Negative for fatigue and unexpected weight change.  HENT: Negative for dental problem, mouth sores and trouble swallowing.   Eyes: Negative for visual disturbance.  Respiratory: Negative for cough, choking, chest tightness, shortness of breath and wheezing.   Cardiovascular: Negative for chest pain, palpitations and leg swelling.  Gastrointestinal: Negative for abdominal distention, abdominal pain, constipation, diarrhea, nausea and vomiting.  Endocrine: Negative for polydipsia, polyphagia and polyuria.  Genitourinary: Negative for dysuria, flank pain, hematuria and urgency.  Musculoskeletal: Negative for back pain, gait problem, myalgias and neck pain.  Skin: Negative for pallor, rash and wound.  Neurological: Negative for seizures, syncope, weakness, numbness and headaches.  Psychiatric/Behavioral: Negative.  Negative for confusion and dysphoric mood. The patient is not nervous/anxious.     Objective:    BP (!) 147/70   Pulse 76   Ht 6' (1.829 m)    Wt 290 lb (131.5 kg)   BMI 39.33 kg/m   Wt Readings from Last 3 Encounters:  06/06/16 290 lb (131.5 kg)  03/07/16 295 lb (133.8 kg)  11/23/15 (!) 303 lb (137.4 kg)    Physical Exam  Constitutional: He is oriented to person, place, and time. He appears well-developed and well-nourished. He is cooperative. No distress.  HENT:  Head: Normocephalic and atraumatic.  Eyes: EOM are normal.  Neck: Normal range of motion. Neck supple. No tracheal deviation present. No thyromegaly present.  Cardiovascular: Normal rate, S1 normal, S2 normal and normal heart sounds.  Exam reveals no gallop.   No murmur heard. Pulses:      Dorsalis pedis pulses are 1+ on the right side, and 1+ on the left side.       Posterior tibial pulses are 1+ on the right side, and 1+ on the left side.  Pulmonary/Chest: Breath sounds normal. No respiratory distress. He has no wheezes.  Abdominal: Soft. Bowel sounds are normal. He exhibits no distension. There is no tenderness. There is no guarding and no CVA tenderness.  Musculoskeletal: He exhibits no edema.       Right shoulder: He exhibits no swelling and no deformity.  Neurological: He is alert and oriented to person, place, and time. He has normal strength and  normal reflexes. No cranial nerve deficit or sensory deficit. Gait normal.  Skin: Skin is warm and dry. No rash noted. No cyanosis. Nails show no clubbing.  Psychiatric: He has a normal mood and affect. His speech is normal and behavior is normal. Judgment and thought content normal. Cognition and memory are normal.    Results for orders placed or performed in visit on 05/30/16  Comprehensive metabolic panel  Result Value Ref Range   Sodium 136 135 - 146 mmol/L   Potassium 4.5 3.5 - 5.3 mmol/L   Chloride 106 98 - 110 mmol/L   CO2 21 20 - 31 mmol/L   Glucose, Bld 66 65 - 99 mg/dL   BUN 62 (H) 7 - 25 mg/dL   Creat 3.86 (H) 0.70 - 1.25 mg/dL   Total Bilirubin 0.2 0.2 - 1.2 mg/dL   Alkaline Phosphatase 70 40 -  115 U/L   AST 19 10 - 35 U/L   ALT 19 9 - 46 U/L   Total Protein 5.4 (L) 6.1 - 8.1 g/dL   Albumin 3.3 (L) 3.6 - 5.1 g/dL   Calcium 8.6 8.6 - 10.3 mg/dL  TSH  Result Value Ref Range   TSH 0.18 (L) 0.40 - 4.50 mIU/L  T4, free  Result Value Ref Range   Free T4 1.1 0.8 - 1.8 ng/dL  Hemoglobin A1c  Result Value Ref Range   Hgb A1c MFr Bld 6.2 (H) <5.7 %   Mean Plasma Glucose 131 mg/dL       Diabetic Labs (most recent): Lab Results  Component Value Date   HGBA1C 6.2 (H) 05/30/2016   HGBA1C 5.9 (H) 02/27/2016   HGBA1C 6.6 (H) 11/09/2015      Assessment & Plan:   1. 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  Patient came with elevated glucose profile even at fasting, and  recent A1c  6.2% , generally improving from  9.7% .  -His recent logs show near target blood glucose profile.  Glucose logs and insulin administration records pertaining to this visit,  to be scanned into patient's records.  Recent labs reviewed. - Patient remains at a high risk for more acute and chronic complications of diabetes which include CAD, CVA, CKD, retinopathy, and neuropathy. These are all discussed in detail with the patient.  - I have re-counseled the patient on diet management and weight loss  by adopting a carbohydrate restricted / protein rich  Diet. - Patient is advised to stick to a routine mealtimes to eat 3 meals  a day and avoid unnecessary snacks ( to snack only to correct hypoglycemia).   - Suggestion is made for patient to avoid simple carbohydrates   from their diet including Cakes , Desserts, Ice Cream,  Soda (  diet and regular) , Sweet Tea , Candies,  Chips, Cookies, Artificial Sweeteners,   and "Sugar-free" Products .  This will help patient to have stable blood glucose profile and potentially avoid unintended  Weight  gain.  - I have approached patient with the following individualized plan to manage diabetes and patient agrees.  -I advised him to continue  Basaglar  10 units qhs, associated with strict monitoring of BG  BEFORE BREAKFAST .   -Patient is encouraged to call clinic for blood glucose levels less than 70 or above 300 mg /dl.  - Lower Victoza to 1.2 mg  daily.    -Patient is encouraged to call clinic for blood glucose levels less than 70 or above 200 mg /dl.  -Patient is not a candidate for metformin andSGLT2 inhibitors due to CKD. -He is advised to maintain close follow up with his nephrologist. His renal function is seen to be improving.  - Due to some trace pretibial /bilateral feet edema, I advised him to increase his furosemide 20 mg and 40 mg alternate days. He has seen better results with this kind of arrangement  in the past.  - Patient specific target  for A1c; LDL, HDL, Triglycerides, and  Waist Circumference were discussed in detail.  2) BP/HTN: Controlled. Continue current medications including ARB. 3) Lipids/HPL: continue statins. 4)  Weight/Diet:  exercise, and carbohydrates information provided.  5)  Primary hypothyroidism His labs are suggestive of a slight over replacement, however I would continue on current dose of levothyroxine at 250 g by mouth every morning. His TSH is slightly suppressed along with appropriate free T4, and appropriate free T3.   - We discussed about correct intake of levothyroxine, at fasting, with water, separated by at least 30 minutes from breakfast, and separated by more than 4 hours from calcium, iron, multivitamins, acid reflux medications (PPIs). -Patient is made aware of the fact that thyroid hormone replacement is needed for life, dose to be adjusted by periodic monitoring of thyroid function tests.  6) Vitamin D deficiency -His vitamin D level is now better at 48, was 9 at last visit. I advised him to increase vitamin D 50,000 units twice  weekly, and add vitamin D3 5000 units daily for the next 90 days.  7) Chronic Care/Health Maintenance:  -Patient  on ARBI and Statin medications and encouraged to continue to follow up with Ophthalmology,   and nephrology given advancing renal insufficiency , Podiatrist at least yearly or according to recommendations, and advised to  quit smoking. I have recommended yearly flu vaccine and pneumonia vaccination at least every 5 years; moderate intensity exercise for up to 150 minutes weekly; and  sleep for at least 7 hours a day. -I advised him to wear compression sleeve for bilateral pretibial edema. I advised patient to maintain close follow up with their PCP for primary care needs.  Patient is asked to bring meter and  blood glucose logs during their next visit.   Follow up plan: Return in about 6 months (around 12/07/2016) for follow up with pre-visit labs, meter, and logs.  Glade Lloyd, MD Phone: [phone]  Fax: [phone]   06/06/2016, 12:19 PM

## 2016-09-14 ENCOUNTER — Other Ambulatory Visit: Payer: Self-pay | Admitting: "Endocrinology

## 2016-09-17 ENCOUNTER — Other Ambulatory Visit: Payer: Self-pay | Admitting: "Endocrinology

## 2016-10-20 ENCOUNTER — Other Ambulatory Visit: Payer: Self-pay | Admitting: "Endocrinology

## 2016-11-28 LAB — COMPLETE METABOLIC PANEL WITH GFR
AG Ratio: 1.6 (calc) (ref 1.0–2.5)
ALKALINE PHOSPHATASE (APISO): 109 U/L (ref 40–115)
ALT: 24 U/L (ref 9–46)
AST: 17 U/L (ref 10–35)
Albumin: 3.2 g/dL — ABNORMAL LOW (ref 3.6–5.1)
BILIRUBIN TOTAL: 0.3 mg/dL (ref 0.2–1.2)
BUN/Creatinine Ratio: 12 (calc) (ref 6–22)
BUN: 57 mg/dL — AB (ref 7–25)
CHLORIDE: 106 mmol/L (ref 98–110)
CO2: 22 mmol/L (ref 20–32)
CREATININE: 4.9 mg/dL — AB (ref 0.70–1.25)
Calcium: 8.5 mg/dL — ABNORMAL LOW (ref 8.6–10.3)
GFR, Est African American: 14 mL/min/{1.73_m2} — ABNORMAL LOW (ref >=60)
GFR, Est Non African American: 12 mL/min/{1.73_m2} — ABNORMAL LOW (ref >=60)
GLUCOSE: 111 mg/dL — AB (ref 65–99)
Globulin: 2 g/dL (calc) (ref 1.9–3.7)
Potassium: 4.1 mmol/L (ref 3.5–5.3)
SODIUM: 135 mmol/L (ref 135–146)
Total Protein: 5.2 g/dL — ABNORMAL LOW (ref 6.1–8.1)

## 2016-11-28 LAB — T4, FREE: FREE T4: 1 ng/dL (ref 0.8–1.8)

## 2016-11-28 LAB — HEMOGLOBIN A1C
EAG (MMOL/L): 7.7 (calc)
HEMOGLOBIN A1C: 6.5 %{Hb} — AB (ref <5.7)
MEAN PLASMA GLUCOSE: 140 (calc)

## 2016-11-28 LAB — TSH: TSH: 2.46 mIU/L (ref 0.40–4.50)

## 2016-12-02 ENCOUNTER — Other Ambulatory Visit: Payer: Self-pay

## 2016-12-02 MED ORDER — VITAMIN D (ERGOCALCIFEROL) 1.25 MG (50000 UNIT) PO CAPS: ORAL_CAPSULE | ORAL | 4 refills | Status: DC

## 2016-12-04 ENCOUNTER — Other Ambulatory Visit: Payer: Self-pay

## 2016-12-04 MED ORDER — VITAMIN D (ERGOCALCIFEROL) 1.25 MG (50000 UNIT) PO CAPS: ORAL_CAPSULE | ORAL | 4 refills | Status: DC

## 2016-12-05 ENCOUNTER — Ambulatory Visit: Payer: Federal, State, Local not specified - PPO | Admitting: "Endocrinology

## 2016-12-05 ENCOUNTER — Other Ambulatory Visit: Payer: Self-pay | Admitting: "Endocrinology

## 2016-12-19 ENCOUNTER — Other Ambulatory Visit: Payer: Self-pay

## 2016-12-19 MED ORDER — VITAMIN D (ERGOCALCIFEROL) 1.25 MG (50000 UNIT) PO CAPS: ORAL_CAPSULE | ORAL | 4 refills | Status: DC

## 2016-12-20 ENCOUNTER — Other Ambulatory Visit: Payer: Self-pay | Admitting: "Endocrinology

## 2016-12-22 ENCOUNTER — Ambulatory Visit: Payer: Federal, State, Local not specified - PPO | Admitting: "Endocrinology

## 2017-01-02 ENCOUNTER — Ambulatory Visit (INDEPENDENT_AMBULATORY_CARE_PROVIDER_SITE_OTHER): Payer: Federal, State, Local not specified - PPO | Admitting: "Endocrinology

## 2017-01-02 ENCOUNTER — Encounter: Payer: Self-pay | Admitting: "Endocrinology

## 2017-01-02 VITALS — BP 142/81 | HR 73 | Ht 72.0 in | Wt 305.0 lb

## 2017-01-02 DIAGNOSIS — E782 Mixed hyperlipidemia: Secondary | ICD-10-CM | POA: Diagnosis not present

## 2017-01-02 DIAGNOSIS — E1159 Type 2 diabetes mellitus with other circulatory complications: Secondary | ICD-10-CM

## 2017-01-02 DIAGNOSIS — E039 Hypothyroidism, unspecified: Secondary | ICD-10-CM | POA: Diagnosis not present

## 2017-01-02 DIAGNOSIS — E1122 Type 2 diabetes mellitus with diabetic chronic kidney disease: Secondary | ICD-10-CM

## 2017-01-02 DIAGNOSIS — N185 Chronic kidney disease, stage 5: Secondary | ICD-10-CM

## 2017-01-02 DIAGNOSIS — I10 Essential (primary) hypertension: Secondary | ICD-10-CM | POA: Diagnosis not present

## 2017-01-02 NOTE — Patient Instructions (Signed)

## 2017-01-02 NOTE — Progress Notes (Signed)
Subjective:    Patient ID: Cory Steele, male    DOB: 1950/11/05,    Past Medical History:  Diagnosis Date  . CAD (coronary artery disease)   . Diabetes mellitus, type II ([place])   . Hyperlipidemia   . Hypertension   . Hypothyroidism   . Kidney disease   . Obesity   . 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 CORONARY ARTERY BYPASS GRAFT     Social History   Social History  . Marital status: Married    Spouse name: N/A  . Number of children: N/A  . Years of education: N/A   Social History Main Topics  . Smoking status: Current Every Day Smoker  . Smokeless tobacco: Never Used  . Alcohol use No  . Drug use: No  . Sexual activity: Not Asked   Other Topics Concern  . None   Social History Narrative  . None   Outpatient Encounter Prescriptions as of 01/02/2017  Medication Sig  . ferric citrate (AURYXIA) 1 GM 210 MG(Fe) tablet Take 420 mg by mouth 3 (three) times daily with meals.  . sodium bicarbonate 650 MG tablet Take 650 mg by mouth 3 (three) times daily.  Marland Kitchen amLODipine (NORVASC) 5 MG tablet Take 1 tablet (5 mg total) by mouth daily.  Marland Kitchen aspirin 325 MG tablet Take 325 mg by mouth daily.  . B Complex Vitamins (VITAMIN-B COMPLEX PO) Take by mouth.  . B-D ULTRAFINE III SHORT PEN 31G X 8 MM MISC USE TWICE A DAY FOR INJECTING INSULIN  . Cholecalciferol (VITAMIN D3) 5000 units CAPS Take 1 capsule (5,000 Units total) by mouth daily.  . D-5000 5000 units TABS TAKE 1 TABLET BY MOUTH EVERY DAY  . furosemide (LASIX) 20 MG tablet Take 1 tablet (20 mg total) by mouth 2 (two) times daily.  . Insulin Glargine (BASAGLAR KWIKPEN) 100 UNIT/ML SOPN Inject 0.2 mLs (20 Units total) into the skin at bedtime.  Marland Kitchen levothyroxine (SYNTHROID, LEVOTHROID) 125 MCG tablet TAKE 2 TABLETS (250 MCG TOTAL) BY MOUTH DAILY BEFORE BREAKFAST.  Marland Kitchen liraglutide (VICTOZA) 18 MG/3ML SOPN Inject 0.2 mLs (1.2 mg  total) into the skin daily.  . metoprolol (LOPRESSOR) 50 MG tablet TAKE (1) TABLET TWICE A DAY. (Patient taking differently: 100 mg TWICE A DAY.)  . pravastatin (PRAVACHOL) 40 MG tablet Take 40 mg by mouth daily.  . TRUE METRIX BLOOD GLUCOSE TEST test strip USE TO TEST BLOOD SUGAR 4 TIMES A DAY  . TRUEPLUS LANCETS 28G MISC USE AS DIRECTED 2 TIMES A DAY  . Vitamin D, Ergocalciferol, (DRISDOL) 50000 units CAPS capsule TAKE 1 TABLET BY MOUTH 2 TIMES A WEEK  . [DISCONTINUED] gemfibrozil (LOPID) 600 MG tablet Take 600 mg by mouth daily.  . [DISCONTINUED] losartan (COZAAR) 100 MG tablet Take 100 mg by mouth daily.   No facility-administered encounter medications on file as of 01/02/2017.    ALLERGIES: Allergies  Allergen Reactions  . Codeine   . Vancomycin    VACCINATION STATUS:  There is no immunization history on file for this patient.  Diabetes  He presents for his follow-up diabetic visit. He has type 2 diabetes mellitus. Onset time: He was diagnosed at approximate age of 5 years. His disease course has been stable. There are no hypoglycemic associated symptoms. Pertinent negatives for hypoglycemia include no confusion, headaches, nervousness/anxiousness, pallor or seizures. Pertinent negatives for diabetes include no chest  pain, no fatigue, no polydipsia, no polyphagia, no polyuria and no weakness. There are no hypoglycemic complications. Symptoms are stable. Diabetic complications include heart disease, nephropathy, peripheral neuropathy and PVD. Risk factors for coronary artery disease include diabetes mellitus, dyslipidemia, male sex, obesity and tobacco exposure. He is compliant with treatment most of the time. His weight is increasing steadily. He is following a generally unhealthy diet. He rarely participates in exercise. His breakfast blood glucose range is generally 110-130 mg/dl. (He only monitors blood glucose before breakfast.) An ACE inhibitor/angiotensin II receptor blocker is  being taken. Eye exam is current.  Hyperlipidemia  This is a chronic problem. The current episode started more than 1 year ago. Pertinent negatives include no chest pain, myalgias or shortness of breath. Current antihyperlipidemic treatment includes statins. Risk factors for coronary artery disease include dyslipidemia, diabetes mellitus, hypertension, male sex and obesity.  Hypertension  Pertinent negatives include no chest pain, headaches, neck pain, palpitations or shortness of breath. Hypertensive end-organ damage includes PVD. Identifiable causes of hypertension include a thyroid problem.  Thyroid Problem  Presents for follow-up visit. Patient reports no anxiety, constipation, diarrhea, fatigue or palpitations. The symptoms have been stable. Past treatments include levothyroxine. His past medical history is significant for hyperlipidemia.    Review of Systems  Constitutional: Negative for fatigue and unexpected weight change.  HENT: Negative for dental problem, mouth sores and trouble swallowing.   Eyes: Negative for visual disturbance.  Respiratory: Negative for cough, choking, chest tightness, shortness of breath and wheezing.   Cardiovascular: Negative for chest pain, palpitations and leg swelling.  Gastrointestinal: Negative for abdominal distention, abdominal pain, constipation, diarrhea, nausea and vomiting.  Endocrine: Negative for polydipsia, polyphagia and polyuria.  Genitourinary: Negative for dysuria, flank pain, hematuria and urgency.  Musculoskeletal: Negative for back pain, gait problem, myalgias and neck pain.  Skin: Negative for pallor, rash and wound.  Neurological: Negative for seizures, syncope, weakness, numbness and headaches.  Psychiatric/Behavioral: Negative.  Negative for confusion and dysphoric mood. The patient is not nervous/anxious.     Objective:    BP (!) 142/81   Pulse 73   Ht 6' (1.829 m)   Wt (!) 305 lb (138.3 kg)   BMI 41.37 kg/m   Wt Readings  from Last 3 Encounters:  01/02/17 (!) 305 lb (138.3 kg)  06/06/16 290 lb (131.5 kg)  03/07/16 295 lb (133.8 kg)    Physical Exam  Constitutional: He is oriented to person, place, and time. He appears well-developed and well-nourished. He is cooperative. No distress.  HENT:  Head: Normocephalic and atraumatic.  Eyes: EOM are normal.  Neck: Normal range of motion. Neck supple. No tracheal deviation present. No thyromegaly present.  Cardiovascular: Normal rate, S1 normal, S2 normal and normal heart sounds.  Exam reveals no gallop.   No murmur heard. Pulses:      Dorsalis pedis pulses are 1+ on the right side, and 1+ on the left side.       Posterior tibial pulses are 1+ on the right side, and 1+ on the left side.  Pulmonary/Chest: Breath sounds normal. No respiratory distress. He has no wheezes.  Abdominal: Soft. Bowel sounds are normal. He exhibits no distension. There is no tenderness. There is no guarding and no CVA tenderness.  Musculoskeletal: He exhibits no edema.       Right shoulder: He exhibits no swelling and no deformity.  Neurological: He is alert and oriented to person, place, and time. He has normal strength and normal reflexes.  No cranial nerve deficit or sensory deficit. Gait normal.  Skin: Skin is warm and dry. No rash noted. No cyanosis. Nails show no clubbing.  Psychiatric: He has a normal mood and affect. His speech is normal and behavior is normal. Judgment and thought content normal. Cognition and memory are normal.    Results for orders placed or performed in visit on 06/06/16  Hemoglobin A1c  Result Value Ref Range   Hgb A1c MFr Bld 6.5 (H) <5.7 % of total Hgb   Mean Plasma Glucose 140 (calc)   eAG (mmol/L) 7.7 (calc)  T4, free  Result Value Ref Range   Free T4 1.0 0.8 - 1.8 ng/dL  TSH  Result Value Ref Range   TSH 2.46 0.40 - 4.50 mIU/L  COMPLETE METABOLIC PANEL WITH GFR  Result Value Ref Range   Glucose, Bld 111 (H) 65 - 99 mg/dL   BUN 57 (H) 7 - 25  mg/dL   Creat 4.90 (H) 0.70 - 1.25 mg/dL   GFR, Est Non African American 12 (L) > OR = 60 mL/min/1.22m2   GFR, Est African American 14 (L) > OR = 60 mL/min/1.46m2   BUN/Creatinine Ratio 12 6 - 22 (calc)   Sodium 135 135 - 146 mmol/L   Potassium 4.1 3.5 - 5.3 mmol/L   Chloride 106 98 - 110 mmol/L   CO2 22 20 - 32 mmol/L   Calcium 8.5 (L) 8.6 - 10.3 mg/dL   Total Protein 5.2 (L) 6.1 - 8.1 g/dL   Albumin 3.2 (L) 3.6 - 5.1 g/dL   Globulin 2.0 1.9 - 3.7 g/dL (calc)   AG Ratio 1.6 1.0 - 2.5 (calc)   Total Bilirubin 0.3 0.2 - 1.2 mg/dL   Alkaline phosphatase (APISO) 109 40 - 115 U/L   AST 17 10 - 35 U/L   ALT 24 9 - 46 U/L     Diabetic Labs (most recent): Lab Results  Component Value Date   HGBA1C 6.5 (H) 11/27/2016   HGBA1C 6.2 (H) 05/30/2016   HGBA1C 5.9 (H) 02/27/2016      Assessment & Plan:   1. Diabetes mellitus with stage 4 chronic kidney disease ([facility]) His diabetes is  complicated by coronary artery disease status post CABG, recent stent placement, stage 5 CK D, peripheral arterial disease, peripheral neuropathy.  Patient came with Stable A1c of 6.5% unchanged from last visit when it was 6.2%,  generally improving from  9.7% . - His fasting blood glucose profile are all within target range.  -His recent logs show near target blood glucose profile.  Glucose logs and insulin administration records pertaining to this visit,  to be scanned into patient's records.  Recent labs reviewed. - Patient remains at a high risk for more acute and chronic complications of diabetes which include CAD, CVA, CKD, retinopathy, and neuropathy. These are all discussed in detail with the patient.  - I have re-counseled the patient on diet management and weight loss  by adopting a carbohydrate restricted / protein rich  Diet. - Patient is advised to stick to a routine mealtimes to eat 3 meals  a day and avoid unnecessary snacks ( to snack only to correct hypoglycemia).   -  Suggestion is made  for him to avoid simple carbohydrates  from his diet including Cakes, Sweet Desserts / Pastries, Ice Cream, Soda (diet and regular), Sweet Tea, Candies, Chips, Cookies, Store Bought Juices, Alcohol in Excess of  1-2 drinks a day, Artificial Sweeteners, and "Sugar-free" Products. This will  help patient to have stable blood glucose profile and potentially avoid unintended weight gain.   - I have approached patient with the following individualized plan to manage diabetes and patient agrees.  -I advised him to continue  Basaglar  10 units daily at bedtime, associated with strict monitoring of blood glucose at least once a day before breakfast.   -Patient is encouraged to call clinic for blood glucose levels less than 70 or above 300 mg /dl.  - I advised him to continue Victoza 1.2 mg subcutaneously daily.    -Patient is encouraged to call clinic for blood glucose levels less than 70 or above 200 mg /dl.  -Patient is not a candidate for metformin andSGLT2 inhibitors due to CKD. -He is advised to maintain close follow up with his nephrologist. His renal function is  declining to stage 5, with GFR of 12. Reportedly, he is being offered for dialysis fistula placement.  - Patient specific target  for A1c; LDL, HDL, Triglycerides, and  Waist Circumference were discussed in detail.  2) BP/HTN:  Uncontrolled, Continue current medications including ARB. 3) Lipids/HPL: continue statins. 4)  Weight/Diet:  exercise, and carbohydrates information provided.  5)  Primary hypothyroidism - His thyroid function tests are consistent with appropriate replacement. I have advised him to continue on current dose of levothyroxine at 250 g by mouth every morning.    - We discussed about correct intake of levothyroxine, at fasting, with water, separated by at least 30 minutes from breakfast, and separated by more than 4 hours from calcium, iron, multivitamins, acid reflux medications (PPIs). -Patient is made aware of  the fact that thyroid hormone replacement is needed for life, dose to be adjusted by periodic monitoring of thyroid function tests.  6) Vitamin D deficiency -He asked vitamin D was low at 19.  I advised him to increase vitamin D 50,000 units twice weekly, and add vitamin D3 5000 units daily for the next 90 days.  7) Chronic Care/Health Maintenance:  -Patient  on ARBI and Statin medications and encouraged to continue to follow up with Ophthalmology,   and nephrology given advancing renal insufficiency , Podiatrist at least yearly or according to recommendations, and advised to  quit smoking. I have recommended yearly flu vaccine and pneumonia vaccination at least every 5 years; moderate intensity exercise for up to 150 minutes weekly; and  sleep for at least 7 hours a day.  -I advised him to wear compression sleeve for bilateral pretibial edema. I advised patient to maintain close follow up with his PCP for primary care needs.  - Time spent with the patient: 25 min, of which >50% was spent in reviewing his sugar logs , discussing his hypo- and hyper-glycemic episodes, reviewing his current and  previous labs and insulin doses and developing a plan to avoid hypo- and hyper-glycemia.    Follow up plan: Return in about 6 months (around 07/03/2017) for follow up with pre-visit labs, meter, and logs.  Glade Lloyd, MD Phone: [phone]  Fax: [phone]  -  This note was partially dictated with voice recognition software. Similar sounding words can be transcribed inadequately or may not  be corrected upon review.  01/02/2017, 10:47 AM

## 2017-02-03 ENCOUNTER — Other Ambulatory Visit: Payer: Self-pay

## 2017-02-03 MED ORDER — LIRAGLUTIDE 18 MG/3ML ~~LOC~~ SOPN: 1.2000 mg | PEN_INJECTOR | Freq: Every day | SUBCUTANEOUS | 0 refills | Status: DC

## 2017-03-18 ENCOUNTER — Other Ambulatory Visit: Payer: Self-pay | Admitting: "Endocrinology

## 2017-04-27 ENCOUNTER — Other Ambulatory Visit: Payer: Self-pay | Admitting: "Endocrinology

## 2017-05-13 ENCOUNTER — Other Ambulatory Visit: Payer: Self-pay

## 2017-05-13 MED ORDER — VITAMIN D (ERGOCALCIFEROL) 1.25 MG (50000 UNIT) PO CAPS: ORAL_CAPSULE | ORAL | 0 refills | Status: DC

## 2017-06-08 ENCOUNTER — Other Ambulatory Visit: Payer: Self-pay | Admitting: "Endocrinology

## 2017-06-16 ENCOUNTER — Other Ambulatory Visit: Payer: Self-pay | Admitting: "Endocrinology

## 2017-06-25 LAB — COMPLETE METABOLIC PANEL WITH GFR
AG Ratio: 1.5 (calc) (ref 1.0–2.5)
ALBUMIN MSPROF: 3.4 g/dL — AB (ref 3.6–5.1)
ALKALINE PHOSPHATASE (APISO): 96 U/L (ref 40–115)
ALT: 12 U/L (ref 9–46)
AST: 12 U/L (ref 10–35)
BUN / CREAT RATIO: 14 (calc) (ref 6–22)
BUN: 75 mg/dL — AB (ref 7–25)
CO2: 21 mmol/L (ref 20–32)
Calcium: 8.7 mg/dL (ref 8.6–10.3)
Chloride: 105 mmol/L (ref 98–110)
Creat: 5.18 mg/dL — ABNORMAL HIGH (ref 0.70–1.25)
GFR, Est African American: 13 mL/min/{1.73_m2} — ABNORMAL LOW (ref >=60)
GFR, Est Non African American: 11 mL/min/{1.73_m2} — ABNORMAL LOW (ref >=60)
GLOBULIN: 2.3 g/dL (ref 1.9–3.7)
GLUCOSE: 82 mg/dL (ref 65–99)
Potassium: 4.5 mmol/L (ref 3.5–5.3)
SODIUM: 136 mmol/L (ref 135–146)
Total Bilirubin: 0.3 mg/dL (ref 0.2–1.2)
Total Protein: 5.7 g/dL — ABNORMAL LOW (ref 6.1–8.1)

## 2017-06-25 LAB — HEMOGLOBIN A1C
HEMOGLOBIN A1C: 6.2 %{Hb} — AB (ref <5.7)
Mean Plasma Glucose: 131 (calc)
eAG (mmol/L): 7.3 (calc)

## 2017-06-25 LAB — LIPID PANEL
CHOLESTEROL: 221 mg/dL — AB (ref <200)
HDL: 27 mg/dL — ABNORMAL LOW (ref >40)
LDL CHOLESTEROL (CALC): 144 mg/dL — AB
Non-HDL Cholesterol (Calc): 194 mg/dL (calc) — ABNORMAL HIGH (ref <130)
Total CHOL/HDL Ratio: 8.2 (calc) — ABNORMAL HIGH (ref <5.0)
Triglycerides: 324 mg/dL — ABNORMAL HIGH (ref <150)

## 2017-06-25 LAB — VITAMIN D 25 HYDROXY (VIT D DEFICIENCY, FRACTURES): Vit D, 25-Hydroxy: 29 ng/mL — ABNORMAL LOW (ref 30–100)

## 2017-07-03 ENCOUNTER — Ambulatory Visit (INDEPENDENT_AMBULATORY_CARE_PROVIDER_SITE_OTHER): Payer: Federal, State, Local not specified - PPO | Admitting: "Endocrinology

## 2017-07-03 ENCOUNTER — Encounter: Payer: Self-pay | Admitting: "Endocrinology

## 2017-07-03 VITALS — BP 137/67 | HR 80 | Ht 72.0 in | Wt 286.0 lb

## 2017-07-03 DIAGNOSIS — I10 Essential (primary) hypertension: Secondary | ICD-10-CM | POA: Diagnosis not present

## 2017-07-03 DIAGNOSIS — E1122 Type 2 diabetes mellitus with diabetic chronic kidney disease: Secondary | ICD-10-CM | POA: Diagnosis not present

## 2017-07-03 DIAGNOSIS — E039 Hypothyroidism, unspecified: Secondary | ICD-10-CM

## 2017-07-03 DIAGNOSIS — E782 Mixed hyperlipidemia: Secondary | ICD-10-CM | POA: Diagnosis not present

## 2017-07-03 DIAGNOSIS — N185 Chronic kidney disease, stage 5: Secondary | ICD-10-CM

## 2017-07-03 MED ORDER — PREGABALIN 75 MG PO CAPS: 75.0000 mg | ORAL_CAPSULE | Freq: Two times a day (BID) | ORAL | 2 refills | Status: DC

## 2017-07-03 NOTE — Progress Notes (Signed)
Subjective:    Patient ID: Cory Steele, male    DOB: 1951/03/12,    Past Medical History:  Diagnosis Date  . CAD (coronary artery disease)   . Diabetes mellitus, type II ([place])   . Hyperlipidemia   . Hypertension   . Hypothyroidism   . Kidney disease   . Obesity   . 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 CORONARY ARTERY BYPASS GRAFT     Social History   Socioeconomic History  . Marital status: Married    Spouse name: Not on file  . Number of children: Not on file  . Years of education: Not on file  . Highest education level: Not on file  Occupational History  . Not on file  Social Needs  . Financial resource strain: Not on file  . Food insecurity:    Worry: Not on file    Inability: Not on file  . Transportation needs:    Medical: Not on file    Non-medical: Not on file  Tobacco Use  . Smoking status: Current Every Day Smoker  . Smokeless tobacco: Never Used  Substance and Sexual Activity  . Alcohol use: No    Alcohol/week: 0.0 oz  . Drug use: No  . Sexual activity: Not on file  Lifestyle  . Physical activity:    Days per week: Not on file    Minutes per session: Not on file  . Stress: Not on file  Relationships  . Social connections:    Talks on phone: Not on file    Gets together: Not on file    Attends religious service: Not on file    Active member of club or organization: Not on file    Attends meetings of clubs or organizations: Not on file    Relationship status: Not on file  Other Topics Concern  . Not on file  Social History Narrative  . Not on file   Outpatient Encounter Medications as of 07/03/2017  Medication Sig  . amLODipine (NORVASC) 5 MG tablet Take 1 tablet (5 mg total) by mouth daily.  Marland Kitchen aspirin 81 MG tablet Take by mouth daily.   . B Complex Vitamins (VITAMIN-B COMPLEX PO) Take by mouth.  . B-D ULTRAFINE III SHORT PEN 31G X 8 MM  MISC USE TWICE A DAY FOR INJECTING INSULIN  . Cholecalciferol (VITAMIN D3) 5000 units CAPS Take 1 capsule (5,000 Units total) by mouth daily.  . D-5000 5000 units TABS TAKE 1 TABLET BY MOUTH EVERY DAY  . ferric citrate (AURYXIA) 1 GM 210 MG(Fe) tablet Take 420 mg by mouth 3 (three) times daily with meals.  . furosemide (LASIX) 20 MG tablet Take 1 tablet (20 mg total) by mouth 2 (two) times daily. (Patient taking differently: Take 40 mg by mouth 2 (two) times daily. )  . levothyroxine (SYNTHROID, LEVOTHROID) 125 MCG tablet TAKE 2 TABLETS (250 MCG TOTAL) BY MOUTH DAILY BEFORE BREAKFAST.  Marland Kitchen liraglutide (VICTOZA) 18 MG/3ML SOPN Inject 0.2 mLs (1.2 mg total) daily into the skin.  . metoprolol (LOPRESSOR) 50 MG tablet TAKE (1) TABLET TWICE A DAY. (Patient taking differently: 100 mg TWICE A DAY.)  . pravastatin (PRAVACHOL) 80 MG tablet Take 80 mg by mouth daily.   . pregabalin (LYRICA) 75 MG capsule Take 1 capsule (75 mg total) by mouth 2 (two) times daily.  . sodium bicarbonate 650 MG tablet Take  650 mg by mouth 3 (three) times daily.  . TRUE METRIX BLOOD GLUCOSE TEST test strip USE TO TEST BLOOD SUGAR 4 TIMES A DAY  . TRUEPLUS LANCETS 28G MISC USE AS DIRECTED 2 TIMES A DAY  . Vitamin D, Ergocalciferol, (DRISDOL) 50000 units CAPS capsule TAKE 1 TABLET BY MOUTH 2 TIMES A WEEK  . [DISCONTINUED] Insulin Glargine (BASAGLAR KWIKPEN) 100 UNIT/ML SOPN Inject 0.2 mLs (20 Units total) into the skin at bedtime. (Patient taking differently: Inject 10 Units into the skin at bedtime. )   No facility-administered encounter medications on file as of 07/03/2017.    ALLERGIES: Allergies  Allergen Reactions  . Codeine   . Vancomycin    VACCINATION STATUS:  There is no immunization history on file for this patient.  Diabetes  He presents for his follow-up diabetic visit. He has type 2 diabetes mellitus. Onset time: He was diagnosed at approximate age of 75 years. His disease course has been worsening (Worsening  renal function.). There are no hypoglycemic associated symptoms. Pertinent negatives for hypoglycemia include no confusion, headaches, nervousness/anxiousness, pallor or seizures. Pertinent negatives for diabetes include no chest pain, no fatigue, no polydipsia, no polyphagia, no polyuria and no weakness. There are no hypoglycemic complications. Symptoms are stable. Diabetic complications include heart disease, nephropathy, peripheral neuropathy and PVD. Risk factors for coronary artery disease include diabetes mellitus, dyslipidemia, male sex, obesity and tobacco exposure. He is compliant with treatment most of the time. His weight is decreasing steadily. He is following a generally unhealthy diet. He rarely participates in exercise. His breakfast blood glucose range is generally 110-130 mg/dl. His overall blood glucose range is 110-130 mg/dl. An ACE inhibitor/angiotensin II receptor blocker is being taken. Eye exam is current.  Hyperlipidemia  This is a chronic problem. The current episode started more than 1 year ago. The problem is uncontrolled. Exacerbating diseases include diabetes, hypothyroidism and obesity. Pertinent negatives include no chest pain, myalgias or shortness of breath. Current antihyperlipidemic treatment includes statins. Risk factors for coronary artery disease include dyslipidemia, diabetes mellitus, hypertension, male sex, obesity and a sedentary lifestyle.  Hypertension  This is a chronic problem. The current episode started more than 1 year ago. The problem is controlled. Pertinent negatives include no chest pain, headaches, neck pain, palpitations or shortness of breath. Risk factors for coronary artery disease include dyslipidemia, diabetes mellitus, male gender, obesity, sedentary lifestyle and smoking/tobacco exposure. Past treatments include angiotensin blockers. Hypertensive end-organ damage includes kidney disease and PVD. Identifiable causes of hypertension include a thyroid  problem.  Thyroid Problem  Presents for follow-up (He is on levothyroxine 250 mcg p.o. every morning.  His thyroid function tests are consistent with appropriate replacement.) visit. Patient reports no anxiety, constipation, diarrhea, fatigue or palpitations. The symptoms have been stable. Past treatments include levothyroxine. His past medical history is significant for diabetes and hyperlipidemia.    Review of Systems  Constitutional: Negative for fatigue and unexpected weight change.  HENT: Negative for dental problem, mouth sores and trouble swallowing.   Eyes: Negative for visual disturbance.  Respiratory: Negative for cough, choking, chest tightness, shortness of breath and wheezing.   Cardiovascular: Negative for chest pain, palpitations and leg swelling.  Gastrointestinal: Negative for abdominal distention, abdominal pain, constipation, diarrhea, nausea and vomiting.  Endocrine: Negative for polydipsia, polyphagia and polyuria.  Genitourinary: Negative for dysuria, flank pain, hematuria and urgency.  Musculoskeletal: Negative for back pain, gait problem, myalgias and neck pain.  Skin: Negative for pallor, rash and wound.  Neurological: Negative  for seizures, syncope, weakness, numbness and headaches.  Psychiatric/Behavioral: Negative for confusion and dysphoric mood. The patient is not nervous/anxious.     Objective:    BP 137/67   Pulse 80   Ht 6' (1.829 m)   Wt 286 lb (129.7 kg)   BMI 38.79 kg/m   Wt Readings from Last 3 Encounters:  07/03/17 286 lb (129.7 kg)  01/02/17 (!) 305 lb (138.3 kg)  06/06/16 290 lb (131.5 kg)    Physical Exam  Constitutional: He is oriented to person, place, and time. He appears well-developed. He is cooperative. No distress.  HENT:  Head: Normocephalic and atraumatic.  Eyes: EOM are normal.  Neck: Normal range of motion. Neck supple. No tracheal deviation present. No thyromegaly present.  Cardiovascular: Normal rate, S1 normal and S2  normal. Exam reveals no gallop.  No murmur heard. Pulses:      Dorsalis pedis pulses are 1+ on the right side, and 1+ on the left side.       Posterior tibial pulses are 1+ on the right side, and 1+ on the left side.  Pulmonary/Chest: Effort normal. No respiratory distress. He has no wheezes.  Abdominal: He exhibits no distension. There is no tenderness. There is no guarding and no CVA tenderness.  Musculoskeletal: He exhibits no edema.       Right shoulder: He exhibits no swelling and no deformity.  Neurological: He is alert and oriented to person, place, and time. He has normal strength and normal reflexes. No cranial nerve deficit or sensory deficit. Gait normal.  Skin: Skin is warm and dry. No rash noted. No cyanosis. Nails show no clubbing.  Psychiatric: He has a normal mood and affect. His speech is normal. Judgment normal. Cognition and memory are normal.    Results for orders placed or performed in visit on 01/02/17  COMPLETE METABOLIC PANEL WITH GFR  Result Value Ref Range   Glucose, Bld 82 65 - 99 mg/dL   BUN 75 (H) 7 - 25 mg/dL   Creat 5.18 (H) 0.70 - 1.25 mg/dL   GFR, Est Non African American 11 (L) > OR = 60 mL/min/1.81m2   GFR, Est African American 13 (L) > OR = 60 mL/min/1.40m2   BUN/Creatinine Ratio 14 6 - 22 (calc)   Sodium 136 135 - 146 mmol/L   Potassium 4.5 3.5 - 5.3 mmol/L   Chloride 105 98 - 110 mmol/L   CO2 21 20 - 32 mmol/L   Calcium 8.7 8.6 - 10.3 mg/dL   Total Protein 5.7 (L) 6.1 - 8.1 g/dL   Albumin 3.4 (L) 3.6 - 5.1 g/dL   Globulin 2.3 1.9 - 3.7 g/dL (calc)   AG Ratio 1.5 1.0 - 2.5 (calc)   Total Bilirubin 0.3 0.2 - 1.2 mg/dL   Alkaline phosphatase (APISO) 96 40 - 115 U/L   AST 12 10 - 35 U/L   ALT 12 9 - 46 U/L  Hemoglobin A1c  Result Value Ref Range   Hgb A1c MFr Bld 6.2 (H) <5.7 % of total Hgb   Mean Plasma Glucose 131 (calc)   eAG (mmol/L) 7.3 (calc)  Lipid panel  Result Value Ref Range   Cholesterol 221 (H) <200 mg/dL   HDL 27 (L) >40 mg/dL    Triglycerides 324 (H) <150 mg/dL   LDL Cholesterol (Calc) 144 (H) mg/dL (calc)   Total CHOL/HDL Ratio 8.2 (H) <5.0 (calc)   Non-HDL Cholesterol (Calc) 194 (H) <130 mg/dL (calc)  VITAMIN D 25 Hydroxy (Vit-D  Deficiency, Fractures)  Result Value Ref Range   Vit D, 25-Hydroxy 29 (L) 30 - 100 ng/mL    Diabetic Labs (most recent): Lab Results  Component Value Date   HGBA1C 6.2 (H) 06/24/2017   HGBA1C 6.5 (H) 11/27/2016   HGBA1C 6.2 (H) 05/30/2016    Lipid Panel     Component Value Date/Time   CHOL 221 (H) 06/24/2017 0911   TRIG 324 (H) 06/24/2017 0911   HDL 27 (L) 06/24/2017 0911   CHOLHDL 8.2 (H) 06/24/2017 0911   LDLCALC 144 (H) 06/24/2017 0911   Results for MARITZA, [place] (MRN [id number]) as of 07/03/2017 11:08  Ref. Range 05/30/2016 08:53 11/27/2016 08:12  TSH Latest Ref Range: 0.40 - 4.50 mIU/L 0.18 (L) 2.46  T4,Free(Direct) Latest Ref Range: 0.8 - 1.8 ng/dL 1.1 1.0    Assessment & Plan:   1. Diabetes mellitus with stage 4 chronic kidney disease ([facility]) His diabetes is  complicated by coronary artery disease status post CABG, recent stent placement, stage 5 CK D, peripheral arterial disease, peripheral neuropathy.  -Patient came with continued improvement in his diabetes with A1c of 6.2%, improving from 6.5% last visit.  Overall he has improved his A1c from 9.7%.   - His fasting blood glucose profile are all within target range.  -His recent logs show tightly controlled fasting blood glucose profile.    Glucose logs and insulin administration records pertaining to this visit,  to be scanned into patient's records.  Recent labs reviewed. - Patient remains at a high risk for more acute and chronic complications of diabetes which include CAD, CVA, CKD, retinopathy, and neuropathy. These are all discussed in detail with the patient.  - I have re-counseled the patient on diet management and weight loss  by adopting a carbohydrate restricted / protein rich  Diet. - Patient is advised  to stick to a routine mealtimes to eat 3 meals  a day and avoid unnecessary snacks ( to snack only to correct hypoglycemia).   -  Suggestion is made for him to avoid simple carbohydrates  from his diet including Cakes, Sweet Desserts / Pastries, Ice Cream, Soda (diet and regular), Sweet Tea, Candies, Chips, Cookies, Store Bought Juices, Alcohol in Excess of  1-2 drinks a day, Artificial Sweeteners, and "Sugar-free" Products. This will help patient to have stable blood glucose profile and potentially avoid unintended weight gain.  - I have approached patient with the following individualized plan to manage diabetes and patient agrees.  -Given his presentation with tight fasting blood glucose profile, I advised him to hold basal insulin for now.   - I advised him to continue Victoza 1.2 mg subcutaneously daily.  -Patient is encouraged to call clinic for blood glucose levels less than 70 or above 200 mg /dl.  -Patient is not a candidate for metformin andSGLT2 inhibitors due to CKD. -He is advised to maintain close follow up with his nephrologist,  His renal function is  declining to stage 5, with GFR of 11, potassium 4.5.  He is now status post fistula placement in left upper extremity.  He is also on renal transplant list per his reports.  - Patient specific target  for A1c; LDL, HDL, Triglycerides, and  Waist Circumference were discussed in detail.  2) BP/HTN: His blood pressure is controlled to target.  He is advised to continue current medications including ARB. 3) Lipids/HPL: Uncontrolled lipid panel with LDL of 144, he is advised to continue pravastatin 80 mg p.o. nightly.  He will be  considered for Repatha if his dyslipidemia is not controlled with statins.  4)  Weight/Diet:  exercise, and carbohydrates information provided.  5)  Primary hypothyroidism  - His thyroid function tests are consistent with appropriate replacement. I have advised him to continue on current dose of levothyroxine  at 250 g by mouth every morning.    - We discussed about correct intake of levothyroxine, at fasting, with water, separated by at least 30 minutes from breakfast, and separated by more than 4 hours from calcium, iron, multivitamins, acid reflux medications (PPIs). -Patient is made aware of the fact that thyroid hormone replacement is needed for life, dose to be adjusted by periodic monitoring of thyroid function tests.  6) Vitamin D deficiency -His vitamin D is better at 35.  I advised him to stay on his current vitamin D regimen : vitamin D 50,000 units twice weekly, and add vitamin D3 5000 units daily for the next 90 days.  7) Chronic Care/Health Maintenance:  -Patient  on ARBI and Statin medications and encouraged to continue to follow up with Ophthalmology,   and nephrology given advancing renal insufficiency , Podiatrist at least yearly or according to recommendations, and advised to  quit smoking. I have recommended yearly flu vaccine and pneumonia vaccination at least every 5 years; moderate intensity exercise for up to 150 minutes weekly; and  sleep for at least 7 hours a day. -For his complaint of bilateral tingling, he is given a trial of Lyrica 75 mg p.o. twice daily. I advised patient to maintain close follow up with his PCP for primary care needs.  - Time spent with the patient: 25 min, of which >50% was spent in reviewing his blood glucose logs , discussing his hypo- and hyper-glycemic episodes, reviewing his current and  previous labs and insulin doses and developing a plan to avoid hypo- and hyper-glycemia. Please refer to Patient Instructions for Blood Glucose Monitoring and Insulin/Medications Dosing Guide"  in media tab for additional information. Cory Steele participated in the discussions, expressed understanding, and voiced agreement with the above plans.  All questions were answered to his satisfaction. he is encouraged to contact clinic should he have any questions or  concerns prior to his return visit.   Follow up plan: Return in about 6 months (around 01/02/2018) for follow up with pre-visit labs.  Glade Lloyd, MD Phone: [phone]  Fax: [phone]  -  This note was partially dictated with voice recognition software. Similar sounding words can be transcribed inadequately or may not  be corrected upon review.  07/03/2017, 11:03 AM

## 2017-07-03 NOTE — Patient Instructions (Signed)

## 2017-07-16 ENCOUNTER — Other Ambulatory Visit: Payer: Self-pay | Admitting: "Endocrinology

## 2017-07-24 ENCOUNTER — Encounter (HOSPITAL_COMMUNITY): Payer: Self-pay | Admitting: Emergency Medicine

## 2017-07-24 ENCOUNTER — Emergency Department (HOSPITAL_COMMUNITY): Payer: Federal, State, Local not specified - PPO

## 2017-07-24 ENCOUNTER — Other Ambulatory Visit: Payer: Self-pay

## 2017-07-24 ENCOUNTER — Emergency Department (HOSPITAL_COMMUNITY)
Admission: EM | Admit: 2017-07-24 | Discharge: 2017-07-24 | Disposition: A | Discharge status: Hospice | Payer: Federal, State, Local not specified - PPO | Attending: Emergency Medicine | Admitting: Emergency Medicine

## 2017-07-24 DIAGNOSIS — R0602 Shortness of breath: Secondary | ICD-10-CM | POA: Diagnosis present

## 2017-07-24 DIAGNOSIS — J9601 Acute respiratory failure with hypoxia: Secondary | ICD-10-CM | POA: Insufficient documentation

## 2017-07-24 DIAGNOSIS — E119 Type 2 diabetes mellitus without complications: Secondary | ICD-10-CM | POA: Insufficient documentation

## 2017-07-24 DIAGNOSIS — J810 Acute pulmonary edema: Secondary | ICD-10-CM | POA: Diagnosis not present

## 2017-07-24 DIAGNOSIS — I10 Essential (primary) hypertension: Secondary | ICD-10-CM | POA: Diagnosis not present

## 2017-07-24 DIAGNOSIS — R7989 Other specified abnormal findings of blood chemistry: Secondary | ICD-10-CM | POA: Diagnosis not present

## 2017-07-24 DIAGNOSIS — Z7901 Long term (current) use of anticoagulants: Secondary | ICD-10-CM | POA: Insufficient documentation

## 2017-07-24 DIAGNOSIS — R778 Other specified abnormalities of plasma proteins: Secondary | ICD-10-CM

## 2017-07-24 DIAGNOSIS — R0789 Other chest pain: Secondary | ICD-10-CM

## 2017-07-24 DIAGNOSIS — F17210 Nicotine dependence, cigarettes, uncomplicated: Secondary | ICD-10-CM | POA: Insufficient documentation

## 2017-07-24 DIAGNOSIS — I2510 Atherosclerotic heart disease of native coronary artery without angina pectoris: Secondary | ICD-10-CM | POA: Insufficient documentation

## 2017-07-24 DIAGNOSIS — E039 Hypothyroidism, unspecified: Secondary | ICD-10-CM | POA: Insufficient documentation

## 2017-07-24 DIAGNOSIS — Z79899 Other long term (current) drug therapy: Secondary | ICD-10-CM | POA: Diagnosis not present

## 2017-07-24 LAB — CBC WITH DIFFERENTIAL/PLATELET
Basophils Absolute: 0 10*3/uL (ref 0.0–0.1)
Basophils Relative: 0 %
EOS ABS: 0 10*3/uL (ref 0.0–0.7)
Eosinophils Relative: 0 %
HCT: 26.7 % — ABNORMAL LOW (ref 39.0–52.0)
Hemoglobin: 9.1 g/dL — ABNORMAL LOW (ref 13.0–17.0)
LYMPHS ABS: 1.5 10*3/uL (ref 0.7–4.0)
LYMPHS PCT: 10 %
MCH: 31.5 pg (ref 26.0–34.0)
MCHC: 34.1 g/dL (ref 30.0–36.0)
MCV: 92.4 fL (ref 78.0–100.0)
Monocytes Absolute: 1.1 10*3/uL — ABNORMAL HIGH (ref 0.1–1.0)
Monocytes Relative: 7 %
NEUTROS PCT: 83 %
Neutro Abs: 12.6 10*3/uL — ABNORMAL HIGH (ref 1.7–7.7)
Platelets: 241 10*3/uL (ref 150–400)
RBC: 2.89 MIL/uL — ABNORMAL LOW (ref 4.22–5.81)
RDW: 14.1 % (ref 11.5–15.5)
WBC: 15.2 10*3/uL — AB (ref 4.0–10.5)

## 2017-07-24 LAB — COMPREHENSIVE METABOLIC PANEL
ALK PHOS: 75 U/L (ref 38–126)
ALT: 23 U/L (ref 17–63)
AST: 23 U/L (ref 15–41)
Albumin: 3.5 g/dL (ref 3.5–5.0)
Anion gap: 14 (ref 5–15)
BUN: 102 mg/dL — AB (ref 6–20)
CALCIUM: 9.8 mg/dL (ref 8.9–10.3)
CO2: 16 mmol/L — AB (ref 22–32)
CREATININE: 6.7 mg/dL — AB (ref 0.61–1.24)
Chloride: 106 mmol/L (ref 101–111)
GFR calc non Af Amer: 8 mL/min — ABNORMAL LOW (ref >60)
GFR, EST AFRICAN AMERICAN: 9 mL/min — AB (ref >60)
GLUCOSE: 192 mg/dL — AB (ref 65–99)
Potassium: 5 mmol/L (ref 3.5–5.1)
SODIUM: 136 mmol/L (ref 135–145)
Total Bilirubin: 0.8 mg/dL (ref 0.3–1.2)
Total Protein: 6.6 g/dL (ref 6.5–8.1)

## 2017-07-24 LAB — BRAIN NATRIURETIC PEPTIDE: B Natriuretic Peptide: 1257 pg/mL — ABNORMAL HIGH (ref 0.0–100.0)

## 2017-07-24 LAB — TROPONIN I: Troponin I: 2.65 ng/mL (ref <0.03)

## 2017-07-24 MED ORDER — ALBUTEROL SULFATE (2.5 MG/3ML) 0.083% IN NEBU: 5.0000 mg | INHALATION_SOLUTION | Freq: Once | RESPIRATORY_TRACT | Status: DC

## 2017-07-24 MED ORDER — LEVALBUTEROL HCL 1.25 MG/0.5ML IN NEBU
1.2500 mg | INHALATION_SOLUTION | Freq: Once | RESPIRATORY_TRACT | Status: AC
  Administered 2017-07-24: 1.25 mg via RESPIRATORY_TRACT
  Filled 2017-07-24: qty 0.5

## 2017-07-24 MED ORDER — ASPIRIN 81 MG PO CHEW
324.0000 mg | CHEWABLE_TABLET | Freq: Once | ORAL | Status: AC
  Administered 2017-07-24: 324 mg via ORAL
  Filled 2017-07-24: qty 4

## 2017-07-24 MED ORDER — NITROGLYCERIN IN D5W 200-5 MCG/ML-% IV SOLN
5.0000 ug/min | Freq: Once | INTRAVENOUS | Status: AC
  Administered 2017-07-24: 16.667 ug/min via INTRAVENOUS
  Filled 2017-07-24: qty 250

## 2017-07-24 MED ORDER — METHYLPREDNISOLONE SODIUM SUCC 125 MG IJ SOLR
INTRAMUSCULAR | Status: AC
  Administered 2017-07-24: 125 mg
  Filled 2017-07-24: qty 2

## 2017-07-24 MED ORDER — FUROSEMIDE 10 MG/ML IJ SOLN
60.0000 mg | Freq: Once | INTRAMUSCULAR | Status: AC
  Administered 2017-07-24: 60 mg via INTRAVENOUS
  Filled 2017-07-24: qty 6

## 2017-07-24 MED ORDER — LEVALBUTEROL HCL 0.63 MG/3ML IN NEBU
INHALATION_SOLUTION | RESPIRATORY_TRACT | Status: AC
  Administered 2017-07-24: 13:00:00
  Filled 2017-07-24: qty 3

## 2017-07-24 NOTE — ED Notes (Signed)
Report to [place], [place]

## 2017-07-24 NOTE — ED Triage Notes (Signed)
Dyspnea sen by PCP on Tuesday sent home with albuterol and today has used his Cpap he uses for night

## 2017-07-24 NOTE — ED Notes (Signed)
Call from resp Pt now on O2/4L with bpap DC'd

## 2017-07-24 NOTE — ED Notes (Signed)
PT WIFE  [name] [phone]

## 2017-07-24 NOTE — ED Notes (Signed)
Call to [facility] who report they continue to work on bed

## 2017-07-24 NOTE — ED Provider Notes (Signed)
[facility] EMERGENCY DEPARTMENT Provider Note   CSN: [id number] Arrival date & time: 07/24/17  1233     History   Chief Complaint Chief Complaint  Patient presents with  . Respiratory Distress    HPI Cory Steele is a 67 y.o. male.  HPI Patient presents with increased shortness of breath.  Was evaluated by his primary physician on Tuesday for shortness of breath and cough.  Was given steroids, inhaler and prescription for antibiotics.  States over the last 2 days his shortness of breath has worsened especially with any exertion.  He continues to have a nonproductive cough.  Complains of central chest tightness that does not radiate.  Increased abdominal distention and lower extremity edema.  Noted to be hypoxic in route by EMS.  Initially placed on BiPAP.  Past Medical History:  Diagnosis Date  . CAD (coronary artery disease)   . Diabetes mellitus, type II ([place])   . Hyperlipidemia   . Hypertension   . Hypothyroidism   . Kidney disease   . Obesity   . Uncontrolled type 2 diabetes mellitus with stage 5 chronic kidney disease [facility])     Patient Active Problem List   Diagnosis Date Noted  . Type 2 diabetes mellitus with stage 5 chronic kidney disease ([place]) 01/02/2017  . Diabetes mellitus with stage 4 chronic kidney disease ([place]) 01/11/2015  . Type 2 diabetes mellitus with vascular disease ([place]) 01/11/2015  . Primary hypothyroidism 01/11/2015  . Hyperlipidemia 01/11/2015  . Vitamin D deficiency 01/11/2015  . Essential hypertension, benign 01/11/2015    Past Surgical History:  Procedure Laterality Date  . CORONARY ARTERY BYPASS GRAFT          Home Medications    Prior to Admission medications   Medication Sig Start Date End Date Taking? Authorizing Provider  albuterol (PROVENTIL) (2.5 MG/3ML) 0.083% nebulizer solution Inhale 3 mLs into the lungs every 6 (six) hours as needed. 07/22/17  Yes [provider]  amitriptyline (ELAVIL) 50 MG tablet Take 50 mg by  mouth 2 (two) times daily.   Yes [provider]  amLODipine (NORVASC) 10 MG tablet Take 10 mg by mouth daily.   Yes [provider]  aspirin 81 MG tablet Take by mouth daily.    Yes [provider]  azithromycin (ZITHROMAX) 250 MG tablet Take 1-2 tablets by mouth as directed. Patient takes 2 tablets on day 1, and 1 tablet on day 2-5 07/22/17  Yes [provider]  B-D ULTRAFINE III SHORT PEN 31G X 8 MM MISC USE TWICE A DAY FOR INJECTING INSULIN 08/09/15  Yes Nida, Marella Chimes, MD  calcitRIOL (ROCALTROL) 0.25 MCG capsule Take 0.25 mcg by mouth daily.   Yes [provider]  Cholecalciferol (VITAMIN D3) 5000 units CAPS Take 1 capsule (5,000 Units total) by mouth daily. Patient taking differently: Take 1,000 Units by mouth daily.  03/07/16  Yes Cassandria Anger, MD  clopidogrel (PLAVIX) 75 MG tablet Take 75 mg by mouth daily. 04/21/17  Yes [provider]  ferric citrate (AURYXIA) 1 GM 210 MG(Fe) tablet Take 420 mg by mouth 3 (three) times daily with meals.   Yes [provider]  furosemide (LASIX) 40 MG tablet Take 40 mg by mouth 2 (two) times daily.   Yes [provider]  glipiZIDE (GLUCOTROL) 5 MG tablet Take by mouth 2 (two) times daily before a meal.   Yes [provider]  levothyroxine (SYNTHROID, LEVOTHROID) 125 MCG tablet TAKE 2 TABLETS (250 MCG TOTAL)  BY MOUTH DAILY BEFORE BREAKFAST. 06/17/17  Yes Nida, Denman George, MD  metoprolol tartrate (LOPRESSOR) 100 MG tablet Take 100 mg by mouth 2 (two) times daily.   Yes [provider]  omeprazole (PRILOSEC) 20 MG capsule Take 20 mg by mouth daily.   Yes [provider]  pravastatin (PRAVACHOL) 80 MG tablet Take 80 mg by mouth daily.    Yes [provider]  predniSONE (STERAPRED UNI-PAK 21 TAB) 10 MG (21) TBPK tablet Take 10-60 mg by mouth daily. Patient takes 6 tablets by mouth on day 1, 5 tablets by mouth on day 2, 4 tablets by mouth on  day 3, 3 tablets by mouth on day 4, 2 tablets by mouth on day 5, and 1 tablet by mouth on the last day   Yes [provider]  pregabalin (LYRICA) 75 MG capsule Take 1 capsule (75 mg total) by mouth 2 (two) times daily. 07/03/17  Yes Nida, Denman George, MD  sodium bicarbonate 650 MG tablet Take 1,300 mg by mouth 2 (two) times daily.    Yes [provider]  TRUE METRIX BLOOD GLUCOSE TEST test strip USE TO TEST BLOOD SUGAR 4 TIMES A DAY 06/22/15  Yes Nida, Denman George, MD  TRUEPLUS LANCETS 28G MISC USE AS DIRECTED 2 TIMES A DAY 11/23/15  Yes Nida, Denman George, MD  VICTOZA 18 MG/3ML SOPN INJECT 0.3 MLS (1.8 MG TOTAL) INTO THE SKIN DAILY. 07/17/17  Yes NidaDenman George, MD  Vitamin D, Ergocalciferol, (DRISDOL) 50000 units CAPS capsule Take 50,000 Units by mouth 2 (two) times a week.   Yes [provider]    Family History Family History  Problem Relation Age of Onset  . Diabetes Mother   . Hypertension Mother   . Hyperlipidemia Mother   . Diabetes Father   . Hypertension Father   . Hyperlipidemia Father     Social History Social History   Tobacco Use  . Smoking status: Current Every Day Smoker  . Smokeless tobacco: Never Used  Substance Use Topics  . Alcohol use: No    Alcohol/week: 0.0 oz  . Drug use: No     Allergies   Codeine and Vancomycin   Review of Systems Review of Systems  Constitutional: Negative for chills and fever.  HENT: Negative for sore throat and trouble swallowing.   Respiratory: Positive for cough, chest tightness, shortness of breath and wheezing.   Cardiovascular: Positive for leg swelling. Negative for chest pain and palpitations.  Gastrointestinal: Positive for abdominal distention. Negative for abdominal pain, constipation, diarrhea, nausea and vomiting.  Musculoskeletal: Negative for back pain, myalgias, neck pain and neck stiffness.  Skin: Negative for rash and wound.  Neurological: Negative for weakness,  light-headedness, numbness and headaches.  All other systems reviewed and are negative.    Physical Exam Updated Vital Signs BP (!) 152/66   Pulse 71   Temp 98.1 F (36.7 C) (Axillary)   Resp 16   Ht 6' (1.829 m)   Wt 129.7 kg (286 lb)   SpO2 98%   BMI 38.79 kg/m   Physical Exam  Constitutional: He is oriented to person, place, and time. He appears well-developed and well-nourished. No distress.  HENT:  Head: Normocephalic and atraumatic.  Mouth/Throat: Oropharynx is clear and moist. No oropharyngeal exudate.  Eyes: Pupils are equal, round, and reactive to light. EOM are normal.  Neck: Normal range of motion. Neck supple.  Cardiovascular: Normal rate and regular rhythm. Exam reveals no gallop and no friction  rub.  No murmur heard. Pulmonary/Chest: Effort normal. He has wheezes.  Diminished air movement throughout.  Expiratory wheezes.  Mild increased work of breathing though patient is speaking in complete sentences.  Abdominal: Soft. Bowel sounds are normal. He exhibits distension. There is no tenderness. There is no rebound and no guarding.  Musculoskeletal: Normal range of motion. He exhibits edema. He exhibits no tenderness.  3+ bilateral lower extremity pitting edema.  Distal pulses intact.  no calf tenderness or asymmetry.  Lymphadenopathy:    He has no cervical adenopathy.  Neurological: He is alert and oriented to person, place, and time.  Moves all extremities without focal deficit.  Sensation fully intact.  Skin: Skin is warm and dry. Capillary refill takes less than 2 seconds. No rash noted. He is not diaphoretic. No erythema.  Psychiatric: He has a normal mood and affect. His behavior is normal.  Nursing note and vitals reviewed.    ED Treatments / Results  Labs (all labs ordered are listed, but only abnormal results are displayed) Labs Reviewed  CBC WITH DIFFERENTIAL/PLATELET - Abnormal; Notable for the following components:      Result Value   WBC 15.2  (*)    RBC 2.89 (*)    Hemoglobin 9.1 (*)    HCT 26.7 (*)    Neutro Abs 12.6 (*)    Monocytes Absolute 1.1 (*)    All other components within normal limits  COMPREHENSIVE METABOLIC PANEL - Abnormal; Notable for the following components:   CO2 16 (*)    Glucose, Bld 192 (*)    BUN 102 (*)    Creatinine, Ser 6.70 (*)    GFR calc non Af Amer 8 (*)    GFR calc Af Amer 9 (*)    All other components within normal limits  BRAIN NATRIURETIC PEPTIDE - Abnormal; Notable for the following components:   B Natriuretic Peptide 1,257.0 (*)    All other components within normal limits  TROPONIN I - Abnormal; Notable for the following components:   Troponin I 2.65 (*)    All other components within normal limits    EKG EKG Interpretation  Date/Time:  Friday Jul 24 2017 12:39:55 EDT Ventricular Rate:  78 PR Interval:    QRS Duration: 146 QT Interval:  399 QTC Calculation: 455 R Axis:   63 Text Interpretation:  Sinus rhythm Prolonged PR interval Nonspecific intraventricular conduction delay Inferior infarct, age indeterminate Confirmed by Julianne Rice [phone]) on 07/24/2017 1:04:10 PM   Radiology Dg Chest Port 1 View  Result Date: 07/24/2017 CLINICAL DATA:  Shortness of breath. EXAM: PORTABLE CHEST 1 VIEW COMPARISON:  No recent prior. FINDINGS: Prior CABG. Cardiomegaly with diffuse bilateral pulmonary interstitial prominence. No pleural effusion or pneumothorax. No acute bony abnormality. IMPRESSION: Prior CABG. Congestive heart failure with bilateral pulmonary interstitial edema. No acute abnormality identified. Electronically Signed   By: Marcello Moores  Register   On: 07/24/2017 13:04    Procedures Procedures (including critical care time)  Medications Ordered in ED Medications  methylPREDNISolone sodium succinate (SOLU-MEDROL) 125 mg/2 mL injection (125 mg  Given 07/24/17 1248)  levalbuterol (XOPENEX) nebulizer solution 1.25 mg (1.25 mg Nebulization Given 07/24/17 1302)  levalbuterol (XOPENEX)  0.63 MG/3ML nebulizer solution (  Given 07/24/17 1254)  furosemide (LASIX) injection 60 mg (60 mg Intravenous Given 07/24/17 1333)  nitroGLYCERIN 50 mg in dextrose 5 % 250 mL (0.2 mg/mL) infusion (16.667 mcg/min Intravenous New Bag/Given 07/24/17 1359)  aspirin chewable tablet 324 mg (324 mg Oral Given 07/24/17 1401)  CRITICAL CARE Performed by: Julianne Rice Total critical care time: 50 minutes Critical care time was exclusive of separately billable procedures and treating other patients. Critical care was necessary to treat or prevent imminent or life-threatening deterioration. Critical care was time spent personally by me on the following activities: development of treatment plan with patient and/or surrogate as well as nursing, discussions with consultants, evaluation of patient's response to treatment, examination of patient, obtaining history from patient or surrogate, ordering and performing treatments and interventions, ordering and review of laboratory studies, ordering and review of radiographic studies, pulse oximetry and re-evaluation of patient's condition.  Initial Impression / Assessment and Plan / ED Course  I have reviewed the triage vital signs and the nursing notes.  Pertinent labs & imaging results that were available during my care of the patient were reviewed by me and considered in my medical decision making (see chart for details).     Chest x-ray with evidence of pulmonary edema.  Elevated BNP.  Placed on BiPAP and nitro drip.  States his shortness of breath and chest tightness have significantly improved.  He was also given aspirin.  Discussed with cardiology fellow at [place] Dr. Fran Lowes.  Agrees with transfer to [facility] ICU.   Final Clinical Impressions(s) / ED Diagnoses   Final diagnoses:  Acute pulmonary edema ([place])  Chest tightness  Acute respiratory failure with hypoxia ([facility])  Elevated troponin    ED Discharge Orders    None       Julianne Rice,  MD 07/24/17 1850

## 2017-07-24 NOTE — ED Notes (Signed)
Call to [place]  Report to Tillie Rung, [place]

## 2017-07-24 NOTE — ED Notes (Signed)
Pt with end stage renal disease  Seen by PCP on Tuesday for dyspnea Followed also by nephro for ESRD  Pt has pitting edema to lower extremities as well as to abd  He has expiratory wheezing

## 2017-07-24 NOTE — ED Notes (Signed)
Awaiting [facility]

## 2017-07-24 NOTE — ED Notes (Signed)
Dr Darreld Mclean at bedside

## 2017-07-24 NOTE — ED Notes (Signed)
Call to Dr Darreld Mclean, Pt is being transferred to [facility] ICU

## 2017-07-24 NOTE — ED Notes (Signed)
Report given to [facility] with [facility] at this time. [facility] does not have a critical truck available at this time. [facility] also consulted and will put pt on the list for transfer. PT has room now at [place]. Report can be called at [phone].

## 2017-07-24 NOTE — ED Notes (Signed)
CRITICAL VALUE ALERT  Critical Value:  Trop 2.65  Date & Time Notied:  07/24/17, 1333  Provider Notified: Dr. Dorthula Matas  Orders Received/Actions taken: No new orders at this time.

## 2017-07-24 NOTE — ED Notes (Signed)
Report to Vaughan Browner, [facility]

## 2017-07-24 NOTE — Progress Notes (Signed)
Removed patient from bipap at this time. Placed patient on 4lpm [place]. Pt is comfortable with no shortness of breath at this time. Patient was instructed to notify the RN if shortness of breath starts back. He and wife agreed to this.

## 2017-09-02 ENCOUNTER — Other Ambulatory Visit: Payer: Self-pay | Admitting: "Endocrinology

## 2017-09-09 ENCOUNTER — Other Ambulatory Visit: Payer: Self-pay | Admitting: "Endocrinology

## 2017-09-24 ENCOUNTER — Other Ambulatory Visit: Payer: Self-pay | Admitting: "Endocrinology

## 2017-11-27 ENCOUNTER — Other Ambulatory Visit: Payer: Self-pay | Admitting: "Endocrinology

## 2017-12-09 ENCOUNTER — Other Ambulatory Visit: Payer: Self-pay | Admitting: "Endocrinology

## 2017-12-19 ENCOUNTER — Other Ambulatory Visit: Payer: Self-pay | Admitting: "Endocrinology

## 2017-12-27 ENCOUNTER — Other Ambulatory Visit: Payer: Self-pay | Admitting: "Endocrinology

## 2017-12-29 LAB — TSH: TSH: 0.07 mIU/L — ABNORMAL LOW (ref 0.40–4.50)

## 2017-12-29 LAB — T4, FREE: Free T4: 1.4 ng/dL (ref 0.8–1.8)

## 2017-12-29 LAB — HEMOGLOBIN A1C
EAG (MMOL/L): 8.4 (calc)
Hgb A1c MFr Bld: 6.9 % of total Hgb — ABNORMAL HIGH (ref <5.7)
MEAN PLASMA GLUCOSE: 151 (calc)

## 2018-01-04 ENCOUNTER — Encounter: Payer: Self-pay | Admitting: "Endocrinology

## 2018-01-04 ENCOUNTER — Ambulatory Visit (INDEPENDENT_AMBULATORY_CARE_PROVIDER_SITE_OTHER): Payer: Medicare Other | Admitting: "Endocrinology

## 2018-01-04 VITALS — BP 128/69 | HR 90 | Ht 72.0 in | Wt 254.0 lb

## 2018-01-04 DIAGNOSIS — E039 Hypothyroidism, unspecified: Secondary | ICD-10-CM | POA: Diagnosis not present

## 2018-01-04 DIAGNOSIS — I10 Essential (primary) hypertension: Secondary | ICD-10-CM | POA: Diagnosis not present

## 2018-01-04 DIAGNOSIS — E782 Mixed hyperlipidemia: Secondary | ICD-10-CM | POA: Diagnosis not present

## 2018-01-04 DIAGNOSIS — N185 Chronic kidney disease, stage 5: Secondary | ICD-10-CM

## 2018-01-04 DIAGNOSIS — E1122 Type 2 diabetes mellitus with diabetic chronic kidney disease: Secondary | ICD-10-CM | POA: Diagnosis not present

## 2018-01-04 MED ORDER — INSULIN PEN NEEDLE 31G X 8 MM MISC: 5 refills | Status: DC

## 2018-01-04 MED ORDER — LEVOTHYROXINE SODIUM 100 MCG PO TABS: 100.0000 ug | ORAL_TABLET | Freq: Every day | ORAL | 6 refills | Status: DC

## 2018-01-04 NOTE — Progress Notes (Signed)
Endocrinology follow-up note   Subjective:    Patient ID: Cory Steele, male    DOB: 1950-04-17,    Past Medical History:  Diagnosis Date  . CAD (coronary artery disease)   . Diabetes mellitus, type II ([place])   . Hyperlipidemia   . Hypertension   . Hypothyroidism   . Kidney disease   . Obesity   . 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 CORONARY ARTERY BYPASS GRAFT     Social History   Socioeconomic History  . Marital status: Married    Spouse name: Not on file  . Number of children: Not on file  . Years of education: Not on file  . Highest education level: Not on file  Occupational History  . Not on file  Social Needs  . Financial resource strain: Not on file  . Food insecurity:    Worry: Not on file    Inability: Not on file  . Transportation needs:    Medical: Not on file    Non-medical: Not on file  Tobacco Use  . Smoking status: Current Every Day Smoker  . Smokeless tobacco: Never Used  Substance and Sexual Activity  . Alcohol use: No    Alcohol/week: 0.0 standard drinks  . Drug use: No  . Sexual activity: Not on file  Lifestyle  . Physical activity:    Days per week: Not on file    Minutes per session: Not on file  . Stress: Not on file  Relationships  . Social connections:    Talks on phone: Not on file    Gets together: Not on file    Attends religious service: Not on file    Active member of club or organization: Not on file    Attends meetings of clubs or organizations: Not on file    Relationship status: Not on file  Other Topics Concern  . Not on file  Social History Narrative  . Not on file   Outpatient Encounter Medications as of 01/04/2018  Medication Sig  . albuterol (PROVENTIL) (2.5 MG/3ML) 0.083% nebulizer solution Inhale 3 mLs into the lungs every 6 (six) hours as needed.  Marland Kitchen amitriptyline (ELAVIL) 50 MG tablet Take 50 mg by mouth 2  (two) times daily.  Marland Kitchen amLODipine (NORVASC) 10 MG tablet Take 10 mg by mouth daily.  Marland Kitchen aspirin 81 MG tablet Take by mouth daily.   . calcitRIOL (ROCALTROL) 0.25 MCG capsule Take 0.25 mcg by mouth daily.  . Cholecalciferol (VITAMIN D3) 5000 units CAPS Take 1 capsule (5,000 Units total) by mouth daily. (Patient taking differently: Take 1,000 Units by mouth daily. )  . clopidogrel (PLAVIX) 75 MG tablet Take 75 mg by mouth daily.  . D-5000 5000 units TABS TAKE 1 TABLET BY MOUTH EVERY DAY  . ferric citrate (AURYXIA) 1 GM 210 MG(Fe) tablet Take 420 mg by mouth 3 (three) times daily with meals.  . furosemide (LASIX) 40 MG tablet Take 40 mg by mouth 2 (two) times daily.  . Insulin Pen Needle (B-D ULTRAFINE III SHORT PEN) 31G X 8 MM MISC Use as directed  . levothyroxine (SYNTHROID, LEVOTHROID) 100 MCG tablet Take 1 tablet (100 mcg total) by mouth daily before breakfast.  . levothyroxine (SYNTHROID, LEVOTHROID) 125 MCG tablet TAKE 2 TABLETS (250 MCG TOTAL) BY MOUTH DAILY BEFORE BREAKFAST.  . metoprolol tartrate (LOPRESSOR) 100 MG tablet Take 100 mg by mouth 2 (  two) times daily.  Marland Kitchen omeprazole (PRILOSEC) 20 MG capsule Take 20 mg by mouth daily.  . pravastatin (PRAVACHOL) 80 MG tablet Take 80 mg by mouth daily.   . pregabalin (LYRICA) 75 MG capsule Take 1 capsule (75 mg total) by mouth 2 (two) times daily.  . sodium bicarbonate 650 MG tablet Take 1,300 mg by mouth 2 (two) times daily.   . TRUE METRIX BLOOD GLUCOSE TEST test strip USE TO TEST BLOOD SUGAR 4 TIMES A DAY  . TRUEPLUS LANCETS 28G MISC USE AS DIRECTED 2 TIMES A DAY  . VICTOZA 18 MG/3ML SOPN INJECT 1.8 MG UNDER THE SKIN ONCE DAILY  . Vitamin D, Ergocalciferol, (DRISDOL) 50000 units CAPS capsule Take 50,000 Units by mouth 2 (two) times a week.  . Vitamin D, Ergocalciferol, (DRISDOL) 50000 units CAPS capsule TAKE 1 CAPSULE BY MOUTH TWICE A WEEK  . [DISCONTINUED] azithromycin (ZITHROMAX) 250 MG tablet Take 1-2 tablets by mouth as directed. Patient  takes 2 tablets on day 1, and 1 tablet on day 2-5  . [DISCONTINUED] B-D ULTRAFINE III SHORT PEN 31G X 8 MM MISC USE TWICE A DAY FOR INJECTING INSULIN  . [DISCONTINUED] glipiZIDE (GLUCOTROL) 5 MG tablet Take by mouth 2 (two) times daily before a meal.  . [DISCONTINUED] predniSONE (STERAPRED UNI-PAK 21 TAB) 10 MG (21) TBPK tablet Take 10-60 mg by mouth daily. Patient takes 6 tablets by mouth on day 1, 5 tablets by mouth on day 2, 4 tablets by mouth on day 3, 3 tablets by mouth on day 4, 2 tablets by mouth on day 5, and 1 tablet by mouth on the last day   No facility-administered encounter medications on file as of 01/04/2018.    ALLERGIES: Allergies  Allergen Reactions  . Codeine   . Vancomycin    VACCINATION STATUS:  There is no immunization history on file for this patient.  Diabetes  He presents for his follow-up diabetic visit. He has type 2 diabetes mellitus. Onset time: He was diagnosed at approximate age of 47 years. His disease course has been stable (Worsening renal function.). There are no hypoglycemic associated symptoms. Pertinent negatives for hypoglycemia include no confusion, headaches, nervousness/anxiousness, pallor or seizures. Pertinent negatives for diabetes include no chest pain, no fatigue, no polydipsia, no polyphagia, no polyuria and no weakness. There are no hypoglycemic complications. Symptoms are stable. Diabetic complications include heart disease, nephropathy, peripheral neuropathy and PVD. (Now on hemodialysis.) Risk factors for coronary artery disease include diabetes mellitus, dyslipidemia, male sex, obesity and tobacco exposure. He is compliant with treatment most of the time. His weight is decreasing steadily. He is following a generally unhealthy diet. He rarely participates in exercise. An ACE inhibitor/angiotensin II receptor blocker is being taken. Eye exam is current.  Hyperlipidemia  This is a chronic problem. The current episode started more than 1 year ago.  The problem is uncontrolled. Exacerbating diseases include diabetes, hypothyroidism and obesity. Pertinent negatives include no chest pain, myalgias or shortness of breath. Current antihyperlipidemic treatment includes statins. Risk factors for coronary artery disease include dyslipidemia, diabetes mellitus, hypertension, male sex, obesity and a sedentary lifestyle.  Hypertension  This is a chronic problem. The current episode started more than 1 year ago. The problem is controlled. Pertinent negatives include no chest pain, headaches, neck pain, palpitations or shortness of breath. Risk factors for coronary artery disease include dyslipidemia, diabetes mellitus, male gender, obesity, sedentary lifestyle and smoking/tobacco exposure. Past treatments include angiotensin blockers. Hypertensive end-organ damage includes kidney disease and  PVD. Identifiable causes of hypertension include a thyroid problem.  Thyroid Problem  Presents for follow-up (He is on levothyroxine 250 mcg p.o. every morning.  His thyroid function tests are consistent with appropriate replacement.) visit. Patient reports no anxiety, constipation, diarrhea, fatigue or palpitations. The symptoms have been stable. Past treatments include levothyroxine. His past medical history is significant for diabetes and hyperlipidemia.    Review of Systems  Constitutional: Negative for fatigue and unexpected weight change.  HENT: Negative for dental problem, mouth sores and trouble swallowing.   Eyes: Negative for visual disturbance.  Respiratory: Negative for cough, choking, chest tightness, shortness of breath and wheezing.   Cardiovascular: Negative for chest pain, palpitations and leg swelling.  Gastrointestinal: Negative for abdominal distention, abdominal pain, constipation, diarrhea, nausea and vomiting.  Endocrine: Negative for polydipsia, polyphagia and polyuria.  Genitourinary: Negative for dysuria, flank pain, hematuria and urgency.   Musculoskeletal: Negative for back pain, gait problem, myalgias and neck pain.  Skin: Negative for pallor, rash and wound.  Neurological: Negative for seizures, syncope, weakness, numbness and headaches.  Psychiatric/Behavioral: Negative for confusion and dysphoric mood. The patient is not nervous/anxious.     Objective:    BP 128/69   Pulse 90   Ht 6' (1.829 m)   Wt 254 lb (115.2 kg)   BMI 34.45 kg/m   Wt Readings from Last 3 Encounters:  01/04/18 254 lb (115.2 kg)  07/24/17 286 lb (129.7 kg)  07/03/17 286 lb (129.7 kg)    Physical Exam  Constitutional: He is oriented to person, place, and time. He appears well-developed. He is cooperative. No distress.  HENT:  Head: Normocephalic and atraumatic.  Eyes: EOM are normal.  Neck: Normal range of motion. Neck supple. No tracheal deviation present. No thyromegaly present.  Cardiovascular: Normal rate, S1 normal and S2 normal. Exam reveals no gallop.  No murmur heard. Pulses:      Dorsalis pedis pulses are 1+ on the right side, and 1+ on the left side.       Posterior tibial pulses are 1+ on the right side, and 1+ on the left side.  Pulmonary/Chest: Effort normal. No respiratory distress. He has no wheezes.  Abdominal: He exhibits no distension. There is no tenderness. There is no guarding and no CVA tenderness.  Musculoskeletal: He exhibits no edema.       Right shoulder: He exhibits no swelling and no deformity.  Neurological: He is alert and oriented to person, place, and time. He has normal strength and normal reflexes. No cranial nerve deficit or sensory deficit. Gait normal.  Skin: Skin is warm and dry. No rash noted. No cyanosis. Nails show no clubbing.  Psychiatric: He has a normal mood and affect. His speech is normal. Judgment normal. Cognition and memory are normal.   Recent Results (from the past 2160 hour(s))  Hemoglobin A1c     Status: Abnormal   Collection Time: 12/28/17  8:58 AM  Result Value Ref Range   Hgb A1c  MFr Bld 6.9 (H) <5.7 % of total Hgb    Comment: For someone without known diabetes, a hemoglobin A1c value of 6.5% or greater indicates that they may have  diabetes and this should be confirmed with a follow-up  test. . For someone with known diabetes, a value <7% indicates  that their diabetes is well controlled and a value  greater than or equal to 7% indicates suboptimal  control. A1c targets should be individualized based on  duration of diabetes, age, comorbid  conditions, and  other considerations. . Currently, no consensus exists regarding use of hemoglobin A1c for diagnosis of diabetes for children. .    Mean Plasma Glucose 151 (calc)   eAG (mmol/L) 8.4 (calc)  T4, free     Status: None   Collection Time: 12/28/17  8:58 AM  Result Value Ref Range   Free T4 1.4 0.8 - 1.8 ng/dL  TSH     Status: Abnormal   Collection Time: 12/28/17  8:58 AM  Result Value Ref Range   TSH 0.07 (L) 0.40 - 4.50 mIU/L      Diabetic Labs (most recent): Lab Results  Component Value Date   HGBA1C 6.9 (H) 12/28/2017   HGBA1C 6.2 (H) 06/24/2017   HGBA1C 6.5 (H) 11/27/2016    Lipid Panel     Component Value Date/Time   CHOL 221 (H) 06/24/2017 0911   TRIG 324 (H) 06/24/2017 0911   HDL 27 (L) 06/24/2017 0911   CHOLHDL 8.2 (H) 06/24/2017 0911   LDLCALC 144 (H) 06/24/2017 0911     Assessment & Plan:   1. Diabetes mellitus with stage 4 chronic kidney disease ([facility]) His diabetes is  complicated by coronary artery disease status post CABG, recent stent placement, ESRD, peripheral arterial disease, peripheral neuropathy.  -He came with stable A1c of 6.9%, overall he has improved his A1c from 9.7%.     Recent labs reviewed, patient now on hemodialysis for ESRD. - Patient remains at a high risk for more acute and chronic complications of diabetes which include CAD, CVA, CKD, retinopathy, and neuropathy. These are all discussed in detail with the patient.  - I have re-counseled the patient on  diet management and weight loss  by adopting a carbohydrate restricted / protein rich  Diet. - Patient is advised to stick to a routine mealtimes to eat 3 meals  a day and avoid unnecessary snacks ( to snack only to correct hypoglycemia).   -  Suggestion is made for him to avoid simple carbohydrates  from his diet including Cakes, Sweet Desserts / Pastries, Ice Cream, Soda (diet and regular), Sweet Tea, Candies, Chips, Cookies, Store Bought Juices, Alcohol in Excess of  1-2 drinks a day, Artificial Sweeteners, and "Sugar-free" Products. This will help patient to have stable blood glucose profile and potentially avoid unintended weight gain.   - I have approached patient with the following individualized plan to manage diabetes and patient agrees.  -I advised him to discontinue glipizide.  He is advised to continue Victoza 1.2 mg subcutaneously daily.    -Patient is encouraged to call clinic for blood glucose levels less than 70 or above 200 mg /dl.  -Patient is not a candidate for metformin andSGLT2 inhibitors due to ESRD. -He is advised to maintain close follow up with his nephrologist.  - Patient specific target  for A1c; LDL, HDL, Triglycerides, and  Waist Circumference were discussed in detail.  2) BP/HTN: His blood pressure is controlled to target.  He is advised to continue current medications including ARB. 3) Lipids/HPL: Uncontrolled lipid panel with LDL of 144, he is advised to continue pravastatin 80 mg p.o. nightly.  He will be considered for Repatha if his dyslipidemia is not controlled with statins.  4)  Weight/Diet:  exercise, and carbohydrates information provided.  5)  Primary hypothyroidism  - His thyroid function tests are consistent with over replacement at this time.  This could be due to is a 50 pounds of weight loss since October 2018.  -I advised  him to lower his levothyroxine to 225 mcg p.o. every morning.     - We discussed about correct intake of levothyroxine, at  fasting, with water, separated by at least 30 minutes from breakfast, and separated by more than 4 hours from calcium, iron, multivitamins, acid reflux medications (PPIs). -Patient is made aware of the fact that thyroid hormone replacement is needed for life, dose to be adjusted by periodic monitoring of thyroid function tests.   6) Vitamin D deficiency -His vitamin D is better at 33.  I advised him to stay on his current vitamin D regimen : vitamin D 50,000 units twice weekly, and add vitamin D3 1000 units daily for the next 90 days.  7) Chronic Care/Health Maintenance:  -Patient  on ARBI and Statin medications and encouraged to continue to follow up with Ophthalmology,   and nephrology given advancing renal insufficiency , Podiatrist at least yearly or according to recommendations, and advised to  quit smoking. I have recommended yearly flu vaccine and pneumonia vaccination at least every 5 years; moderate intensity exercise for up to 150 minutes weekly; and  sleep for at least 7 hours a day.  I advised patient to maintain close follow up with his PCP for primary care needs.  - Time spent with the patient: 25 min, of which >50% was spent in reviewing his blood glucose logs , discussing his hypo- and hyper-glycemic episodes, reviewing his current and  previous labs and insulin doses and developing a plan to avoid hypo- and hyper-glycemia. Please refer to Patient Instructions for Blood Glucose Monitoring and Insulin/Medications Dosing Guide"  in media tab for additional information. Cory Steele participated in the discussions, expressed understanding, and voiced agreement with the above plans.  All questions were answered to his satisfaction. he is encouraged to contact clinic should he have any questions or concerns prior to his return visit.    Follow up plan: Return in about 6 months (around 07/06/2018) for Follow up with Pre-visit Labs.  Glade Lloyd, MD Phone: [phone]  Fax:  [phone]  -  This note was partially dictated with voice recognition software. Similar sounding words can be transcribed inadequately or may not  be corrected upon review.  01/04/2018, 1:09 PM

## 2018-01-04 NOTE — Patient Instructions (Signed)

## 2018-03-31 ENCOUNTER — Other Ambulatory Visit: Payer: Self-pay | Admitting: "Endocrinology

## 2018-06-30 LAB — VITAMIN D 25 HYDROXY (VIT D DEFICIENCY, FRACTURES): VIT D 25 HYDROXY: 46 ng/mL (ref 30–100)

## 2018-06-30 LAB — TSH: TSH: 0.46 m[IU]/L (ref 0.40–4.50)

## 2018-06-30 LAB — HEMOGLOBIN A1C
HEMOGLOBIN A1C: 6.6 %{Hb} — AB (ref <5.7)
MEAN PLASMA GLUCOSE: 143 (calc)
eAG (mmol/L): 7.9 (calc)

## 2018-06-30 LAB — T4, FREE: FREE T4: 1.3 ng/dL (ref 0.8–1.8)

## 2018-07-07 ENCOUNTER — Ambulatory Visit: Payer: Federal, State, Local not specified - PPO | Admitting: "Endocrinology

## 2018-07-07 ENCOUNTER — Encounter: Payer: Self-pay | Admitting: "Endocrinology

## 2018-07-08 ENCOUNTER — Encounter: Payer: Self-pay | Admitting: "Endocrinology

## 2018-07-08 ENCOUNTER — Ambulatory Visit (INDEPENDENT_AMBULATORY_CARE_PROVIDER_SITE_OTHER): Payer: Federal, State, Local not specified - PPO | Admitting: "Endocrinology

## 2018-07-08 ENCOUNTER — Other Ambulatory Visit: Payer: Self-pay

## 2018-07-08 DIAGNOSIS — E782 Mixed hyperlipidemia: Secondary | ICD-10-CM

## 2018-07-08 DIAGNOSIS — N185 Chronic kidney disease, stage 5: Secondary | ICD-10-CM | POA: Diagnosis not present

## 2018-07-08 DIAGNOSIS — E039 Hypothyroidism, unspecified: Secondary | ICD-10-CM

## 2018-07-08 DIAGNOSIS — E1122 Type 2 diabetes mellitus with diabetic chronic kidney disease: Secondary | ICD-10-CM

## 2018-07-08 NOTE — Progress Notes (Signed)
Endocrinology Telephone Visit Follow up Note -During COVID -19 Pandemic    Subjective:    Patient ID: Cory Steele, male    DOB: 1950-08-22,    Past Medical History:  Diagnosis Date  . CAD (coronary artery disease)   . Diabetes mellitus, type II ([place])   . Hyperlipidemia   . Hypertension   . Hypothyroidism   . Kidney disease   . Obesity   . 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 CORONARY ARTERY BYPASS GRAFT     Social History   Socioeconomic History  . Marital status: Married    Spouse name: Not on file  . Number of children: Not on file  . Years of education: Not on file  . Highest education level: Not on file  Occupational History  . Not on file  Social Needs  . Financial resource strain: Not on file  . Food insecurity:    Worry: Not on file    Inability: Not on file  . Transportation needs:    Medical: Not on file    Non-medical: Not on file  Tobacco Use  . Smoking status: Current Every Day Smoker  . Smokeless tobacco: Never Used  Substance and Sexual Activity  . Alcohol use: No    Alcohol/week: 0.0 standard drinks  . Drug use: No  . Sexual activity: Not on file  Lifestyle  . Physical activity:    Days per week: Not on file    Minutes per session: Not on file  . Stress: Not on file  Relationships  . Social connections:    Talks on phone: Not on file    Gets together: Not on file    Attends religious service: Not on file    Active member of club or organization: Not on file    Attends meetings of clubs or organizations: Not on file    Relationship status: Not on file  Other Topics Concern  . Not on file  Social History Narrative  . Not on file   Outpatient Encounter Medications as of 07/08/2018  Medication Sig  . albuterol (PROVENTIL) (2.5 MG/3ML) 0.083% nebulizer solution Inhale 3 mLs into the  lungs every 6 (six) hours as needed.  Marland Kitchen amitriptyline (ELAVIL) 50 MG tablet Take 50 mg by mouth 2 (two) times daily.  Marland Kitchen amLODipine (NORVASC) 10 MG tablet Take 10 mg by mouth daily.  Marland Kitchen aspirin 81 MG tablet Take by mouth daily.   . calcitRIOL (ROCALTROL) 0.25 MCG capsule Take 0.25 mcg by mouth daily.  . Cholecalciferol (VITAMIN D3) 5000 units CAPS Take 1 capsule (5,000 Units total) by mouth daily. (Patient taking differently: Take 1,000 Units by mouth daily. )  . clopidogrel (PLAVIX) 75 MG tablet Take 75 mg by mouth daily.  . D-5000 5000 units TABS TAKE 1 TABLET BY MOUTH EVERY DAY  . ferric citrate (AURYXIA) 1 GM 210 MG(Fe) tablet Take 420 mg by mouth 3 (three) times daily with meals.  . furosemide (LASIX) 40 MG tablet Take 40 mg by mouth 2 (two) times daily.  . Insulin Pen Needle (B-D ULTRAFINE III SHORT PEN) 31G  X 8 MM MISC Use as directed  . levothyroxine (SYNTHROID, LEVOTHROID) 100 MCG tablet Take 1 tablet (100 mcg total) by mouth daily before breakfast.  . levothyroxine (SYNTHROID, LEVOTHROID) 125 MCG tablet TAKE 2 TABLETS (250 MCG TOTAL) BY MOUTH DAILY BEFORE BREAKFAST.  . metoprolol tartrate (LOPRESSOR) 100 MG tablet Take 100 mg by mouth 2 (two) times daily.  Marland Kitchen omeprazole (PRILOSEC) 20 MG capsule Take 20 mg by mouth daily.  . pravastatin (PRAVACHOL) 80 MG tablet Take 80 mg by mouth daily.   . pregabalin (LYRICA) 75 MG capsule Take 1 capsule (75 mg total) by mouth 2 (two) times daily.  . sodium bicarbonate 650 MG tablet Take 1,300 mg by mouth 2 (two) times daily.   . TRUE METRIX BLOOD GLUCOSE TEST test strip USE TO TEST BLOOD SUGAR 4 TIMES A DAY  . TRUEPLUS LANCETS 28G MISC USE AS DIRECTED 2 TIMES A DAY  . VICTOZA 18 MG/3ML SOPN INJECT 1.8 MG UNDER THE SKIN ONCE DAILY  . Vitamin D, Ergocalciferol, (DRISDOL) 50000 units CAPS capsule Take 50,000 Units by mouth 2 (two) times a week.  . Vitamin D, Ergocalciferol, (DRISDOL) 50000 units CAPS capsule TAKE 1 CAPSULE BY MOUTH TWICE A WEEK   No  facility-administered encounter medications on file as of 07/08/2018.    ALLERGIES: Allergies  Allergen Reactions  . Codeine   . Vancomycin    VACCINATION STATUS:  There is no immunization history on file for this patient.  Diabetes  He presents for his follow-up diabetic visit. He has type 2 diabetes mellitus. Onset time: He was diagnosed at approximate age of 66 years. His disease course has been improving (Worsening renal function.). There are no hypoglycemic associated symptoms. Pertinent negatives for hypoglycemia include no confusion, nervousness/anxiousness, pallor or seizures. Pertinent negatives for diabetes include no fatigue, no polydipsia, no polyphagia, no polyuria and no weakness. There are no hypoglycemic complications. Symptoms are improving. Diabetic complications include heart disease, nephropathy and peripheral neuropathy. (Now on hemodialysis.) Risk factors for coronary artery disease include diabetes mellitus, dyslipidemia, male sex, obesity and tobacco exposure. He is compliant with treatment most of the time. He is following a generally unhealthy diet. He rarely participates in exercise. An ACE inhibitor/angiotensin II receptor blocker is being taken. Eye exam is current.  Hyperlipidemia  This is a chronic problem. The current episode started more than 1 year ago. The problem is uncontrolled. Exacerbating diseases include diabetes, hypothyroidism and obesity. Pertinent negatives include no myalgias. Current antihyperlipidemic treatment includes statins. Risk factors for coronary artery disease include dyslipidemia, diabetes mellitus, hypertension, male sex, obesity and a sedentary lifestyle.  Thyroid Problem  Presents for follow-up (He is on levothyroxine 250 mcg p.o. every morning.  His thyroid function tests are consistent with appropriate replacement.) visit. Patient reports no anxiety, constipation, diarrhea or fatigue. The symptoms have been stable. Past treatments include  levothyroxine. His past medical history is significant for diabetes and hyperlipidemia.   Objective:    There were no vitals taken for this visit.  Wt Readings from Last 3 Encounters:  01/04/18 254 lb (115.2 kg)  07/24/17 286 lb (129.7 kg)  07/03/17 286 lb (129.7 kg)    Recent Results (from the past 2160 hour(s))  Hemoglobin A1c     Status: Abnormal   Collection Time: 06/29/18  7:55 AM  Result Value Ref Range   Hgb A1c MFr Bld 6.6 (H) <5.7 % of total Hgb    Comment: For someone without known diabetes, a hemoglobin A1c value of 6.5%  or greater indicates that they may have  diabetes and this should be confirmed with a follow-up  test. . For someone with known diabetes, a value <7% indicates  that their diabetes is well controlled and a value  greater than or equal to 7% indicates suboptimal  control. A1c targets should be individualized based on  duration of diabetes, age, comorbid conditions, and  other considerations. . Currently, no consensus exists regarding use of hemoglobin A1c for diagnosis of diabetes for children. .    Mean Plasma Glucose 143 (calc)   eAG (mmol/L) 7.9 (calc)  TSH     Status: None   Collection Time: 06/29/18  7:55 AM  Result Value Ref Range   TSH 0.46 0.40 - 4.50 mIU/L  T4, free     Status: None   Collection Time: 06/29/18  7:55 AM  Result Value Ref Range   Free T4 1.3 0.8 - 1.8 ng/dL  VITAMIN D 25 Hydroxy (Vit-D Deficiency, Fractures)     Status: None   Collection Time: 06/29/18  7:55 AM  Result Value Ref Range   Vit D, 25-Hydroxy 46 30 - 100 ng/mL    Comment: Vitamin D Status         25-OH Vitamin D: . Deficiency:                    <20 ng/mL Insufficiency:             20 - 29 ng/mL Optimal:                 > or = 30 ng/mL . For 25-OH Vitamin D testing on patients on  D2-supplementation and patients for whom quantitation  of D2 and D3 fractions is required, the QuestAssureD(TM) 25-OH VIT D, (D2,D3), LC/MS/MS is recommended: order  code  [phone] (patients >92yrs). . For more information on this test, go to: http://education.questdiagnostics.com/faq/FAQ163 (This link is being provided for  informational/educational purposes only.)       Diabetic Labs (most recent): Lab Results  Component Value Date   HGBA1C 6.6 (H) 06/29/2018   HGBA1C 6.9 (H) 12/28/2017   HGBA1C 6.2 (H) 06/24/2017    Lipid Panel     Component Value Date/Time   CHOL 221 (H) 06/24/2017 0911   TRIG 324 (H) 06/24/2017 0911   HDL 27 (L) 06/24/2017 0911   CHOLHDL 8.2 (H) 06/24/2017 0911   LDLCALC 144 (H) 06/24/2017 0911     Assessment & Plan:   1. Diabetes mellitus with stage 4 chronic kidney disease ([facility]) His diabetes is  complicated by coronary artery disease status post CABG, recent stent placement, ESRD, peripheral arterial disease, peripheral neuropathy.  -His previsit labs show A1c of 6.6 percent meaning stable since last visit, overall improving from 9.7%.    Recent labs reviewed, patient now on hemodialysis for ESRD. - Patient remains at a high risk for more acute and chronic complications of diabetes which include CAD, CVA, CKD, retinopathy, and neuropathy. These are all discussed in detail with the patient.  - I have re-counseled the patient on diet management and weight loss  by adopting a carbohydrate restricted / protein rich  Diet. - Patient is advised to stick to a routine mealtimes to eat 3 meals  a day and avoid unnecessary snacks ( to snack only to correct hypoglycemia).     - I have approached patient with the following individualized plan to manage diabetes and patient agrees.  -Continues to do well with less medication.  He is advised to continue Victoza 1.2 mg subcutaneously daily.     -Patient is encouraged to call clinic for blood glucose levels less than 70 or above 200 mg /dl.  -Patient is not a candidate for metformin andSGLT2 inhibitors due to ESRD. -He is advised to maintain close follow up with his  nephrologist.  - Patient specific target  for A1c; LDL, HDL, Triglycerides, and  Waist Circumference were discussed in detail.  2) BP/HTN: He is advised to monitor blood pressure at least once a week and report if it is greater than 160/90 mm Hg.   He is advised to continue current medications including ARB. 3) Lipids/HPL: Uncontrolled lipid panel with LDL of 144, he is advised to continue pravastatin 80 mg p.o. nightly.  He will be considered for Repatha if his dyslipidemia is not controlled with statins. 4)  Primary hypothyroidism  -His previsit labs show appropriate replacement with thyroid hormone.  He is advised to continue levothyroxine to 225 mcg p.o. every morning.    - We discussed about the correct intake of his thyroid hormone, on empty stomach at fasting, with water, separated by at least 30 minutes from breakfast and other medications,  and separated by more than 4 hours from calcium, iron, multivitamins, acid reflux medications (PPIs). -Patient is made aware of the fact that thyroid hormone replacement is needed for life, dose to be adjusted by periodic monitoring of thyroid function tests.    6) Vitamin D deficiency -His vitamin D is better at 41.  I advised him to stay on his current vitamin D regimen : vitamin D 50,000 units twice weekly, and add vitamin D3 1000 units daily for the next 90 days.    I advised patient to maintain close follow up with his PCP for primary care needs.  - Patient Care Time Today:  25 min, of which >50% was spent in reviewing his  current and  previous labs/studies, previous treatments, and medications doses and developing a plan for long-term care based on the latest recommendations for standards of care.  Cory Steele participated in the discussions, expressed understanding, and voiced agreement with the above plans.  All questions were answered to his satisfaction. he is encouraged to contact clinic should he have any questions or concerns prior  to his return visit.     Follow up plan: Return in about 6 months (around 01/07/2019) for Follow up with Pre-visit Labs, Meter, and Logs.  Glade Lloyd, MD Phone: [phone]  Fax: [phone]  -  This note was partially dictated with voice recognition software. Similar sounding words can be transcribed inadequately or may not  be corrected upon review.  07/08/2018, 4:35 PM

## 2018-07-21 ENCOUNTER — Other Ambulatory Visit: Payer: Self-pay | Admitting: "Endocrinology

## 2018-08-31 ENCOUNTER — Other Ambulatory Visit: Payer: Self-pay | Admitting: "Endocrinology

## 2018-10-28 ENCOUNTER — Other Ambulatory Visit: Payer: Self-pay | Admitting: "Endocrinology

## 2019-01-07 LAB — HEMOGLOBIN A1C
Hgb A1c MFr Bld: 6.7 % of total Hgb — ABNORMAL HIGH (ref <5.7)
Mean Plasma Glucose: 146 (calc)
eAG (mmol/L): 8.1 (calc)

## 2019-01-07 LAB — T4, FREE: Free T4: 1.2 ng/dL (ref 0.8–1.8)

## 2019-01-07 LAB — VITAMIN D 25 HYDROXY (VIT D DEFICIENCY, FRACTURES): Vit D, 25-Hydroxy: 46 ng/mL (ref 30–100)

## 2019-01-07 LAB — TSH: TSH: 0.26 mIU/L — ABNORMAL LOW (ref 0.40–4.50)

## 2019-01-11 ENCOUNTER — Ambulatory Visit (INDEPENDENT_AMBULATORY_CARE_PROVIDER_SITE_OTHER): Payer: Federal, State, Local not specified - PPO | Admitting: "Endocrinology

## 2019-01-11 ENCOUNTER — Encounter: Payer: Self-pay | Admitting: "Endocrinology

## 2019-01-11 ENCOUNTER — Other Ambulatory Visit: Payer: Self-pay

## 2019-01-11 DIAGNOSIS — E1122 Type 2 diabetes mellitus with diabetic chronic kidney disease: Secondary | ICD-10-CM | POA: Diagnosis not present

## 2019-01-11 DIAGNOSIS — E782 Mixed hyperlipidemia: Secondary | ICD-10-CM | POA: Diagnosis not present

## 2019-01-11 DIAGNOSIS — N185 Chronic kidney disease, stage 5: Secondary | ICD-10-CM

## 2019-01-11 DIAGNOSIS — E039 Hypothyroidism, unspecified: Secondary | ICD-10-CM

## 2019-01-11 MED ORDER — D-5000 125 MCG (5000 UT) PO TABS: 1.0000 | ORAL_TABLET | Freq: Every day | ORAL | 1 refills | Status: DC

## 2019-01-11 MED ORDER — LEVOTHYROXINE SODIUM 200 MCG PO TABS: 200.0000 ug | ORAL_TABLET | Freq: Every day | ORAL | 1 refills | Status: DC

## 2019-01-11 NOTE — Progress Notes (Signed)
01/11/2019                                                    Endocrinology Telehealth Visit Follow up Note -During COVID -19 Pandemic  This visit type was conducted due to national recommendations for restrictions regarding the COVID-19 Pandemic  in an effort to limit this patient's exposure and mitigate transmission of the corona virus.  Due to his co-morbid illnesses, Cory Steele is at  moderate to high risk for complications without adequate follow up.  This format is felt to be most appropriate for him at this time.  I connected with this patient on 01/11/2019   by telephone and verified that I am speaking with the correct person using two identifiers. Cory Steele, [birth year]. he has verbally consented to this visit. All issues noted in this document were discussed and addressed. The format was not optimal for physical exam.   Subjective:    Patient ID: Cory Steele, male    DOB: 09/03/1950,    Past Medical History:  Diagnosis Date  . CAD (coronary artery disease)   . Diabetes mellitus, type II ([place])   . Hyperlipidemia   . Hypertension   . Hypothyroidism   . Kidney disease   . Obesity   . 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 CORONARY ARTERY BYPASS GRAFT     Social History   Socioeconomic History  . Marital status: Married    Spouse name: Not on file  . Number of children: Not on file  . Years of education: Not on file  . Highest education level: Not on file  Occupational History  . Not on file  Social Needs  . Financial resource strain: Not on file  . Food insecurity    Worry: Not on file    Inability: Not on file  . Transportation needs    Medical: Not on file    Non-medical: Not on file  Tobacco Use  . Smoking status: Current Every Day Smoker  . Smokeless tobacco: Never Used  Substance and Sexual Activity  . Alcohol use: No    Alcohol/week: 0.0 standard  drinks  . Drug use: No  . Sexual activity: Not on file  Lifestyle  . Physical activity    Days per week: Not on file    Minutes per session: Not on file  . Stress: Not on file  Relationships  . Social Herbalist on phone: Not on file    Gets together: Not on file    Attends religious service: Not on file    Active member of club or organization: Not on file    Attends meetings of clubs or organizations: Not on file    Relationship status: Not on file  Other Topics Concern  . Not on file  Social History Narrative  . Not on file   Outpatient Encounter Medications as of 01/11/2019  Medication Sig  . albuterol (PROVENTIL) (2.5 MG/3ML) 0.083% nebulizer solution Inhale 3 mLs into the lungs every 6 (six) hours as needed.  Marland Kitchen amitriptyline (ELAVIL) 50 MG tablet Take 50 mg by mouth 2 (two) times daily.  Marland Kitchen amLODipine (NORVASC) 10 MG tablet Take 10 mg by mouth daily.  Marland Kitchen aspirin 81  MG tablet Take by mouth daily.   . calcitRIOL (ROCALTROL) 0.25 MCG capsule Take 0.25 mcg by mouth daily.  . Cholecalciferol (D-5000) 125 MCG (5000 UT) TABS Take 1 tablet (5,000 Units total) by mouth daily.  . clopidogrel (PLAVIX) 75 MG tablet Take 75 mg by mouth daily.  . ferric citrate (AURYXIA) 1 GM 210 MG(Fe) tablet Take 420 mg by mouth 3 (three) times daily with meals.  . furosemide (LASIX) 40 MG tablet Take 40 mg by mouth 2 (two) times daily.  . Insulin Pen Needle (B-D ULTRAFINE III SHORT PEN) 31G X 8 MM MISC Use as directed  . levothyroxine (SYNTHROID) 200 MCG tablet Take 1 tablet (200 mcg total) by mouth daily before breakfast.  . metoprolol tartrate (LOPRESSOR) 100 MG tablet Take 100 mg by mouth 2 (two) times daily.  Marland Kitchen omeprazole (PRILOSEC) 20 MG capsule Take 20 mg by mouth daily.  . pravastatin (PRAVACHOL) 80 MG tablet Take 80 mg by mouth daily.   . pregabalin (LYRICA) 75 MG capsule Take 1 capsule (75 mg total) by mouth 2 (two) times daily.  . sodium bicarbonate 650 MG tablet Take 1,300 mg by  mouth 2 (two) times daily.   . TRUE METRIX BLOOD GLUCOSE TEST test strip USE TO TEST BLOOD SUGAR 4 TIMES A DAY  . TRUEPLUS LANCETS 28G MISC USE AS DIRECTED 2 TIMES A DAY  . VICTOZA 18 MG/3ML SOPN INJECT 1.8 MG UNDER SKIN ONCE DAILY  . Vitamin D, Ergocalciferol, (DRISDOL) 50000 units CAPS capsule TAKE 1 CAPSULE BY MOUTH TWICE A WEEK  . [DISCONTINUED] Cholecalciferol (VITAMIN D3) 5000 units CAPS Take 1 capsule (5,000 Units total) by mouth daily. (Patient taking differently: Take 1,000 Units by mouth daily. )  . [DISCONTINUED] D-5000 5000 units TABS TAKE 1 TABLET BY MOUTH EVERY DAY  . [DISCONTINUED] levothyroxine (SYNTHROID) 100 MCG tablet TAKE 1 TABLET (100 MCG TOTAL) BY MOUTH DAILY BEFORE BREAKFAST.  . [DISCONTINUED] levothyroxine (SYNTHROID) 125 MCG tablet TAKE 2 TABLETS (250 MCG TOTAL) BY MOUTH DAILY BEFORE BREAKFAST.  . [DISCONTINUED] Vitamin D, Ergocalciferol, (DRISDOL) 50000 units CAPS capsule Take 50,000 Units by mouth 2 (two) times a week.   No facility-administered encounter medications on file as of 01/11/2019.    ALLERGIES: Allergies  Allergen Reactions  . Codeine   . Vancomycin    VACCINATION STATUS:  There is no immunization history on file for this patient.  Diabetes He presents for his follow-up diabetic visit. He has type 2 diabetes mellitus. Onset time: He was diagnosed at approximate age of 7 years. His disease course has been stable (Worsening renal function.). There are no hypoglycemic associated symptoms. Pertinent negatives for hypoglycemia include no confusion, nervousness/anxiousness, pallor or seizures. Pertinent negatives for diabetes include no fatigue, no polydipsia, no polyphagia, no polyuria and no weakness. There are no hypoglycemic complications. Symptoms are stable. Diabetic complications include heart disease, nephropathy and peripheral neuropathy. (Now on hemodialysis.) Risk factors for coronary artery disease include diabetes mellitus, dyslipidemia, male  sex, obesity and tobacco exposure. He is compliant with treatment most of the time. When asked about meal planning, he reported none. He rarely participates in exercise. An ACE inhibitor/angiotensin II receptor blocker is being taken. Eye exam is current.  Hyperlipidemia This is a chronic problem. The current episode started more than 1 year ago. The problem is uncontrolled. Exacerbating diseases include diabetes, hypothyroidism and obesity. Pertinent negatives include no myalgias. Current antihyperlipidemic treatment includes statins. Risk factors for coronary artery disease include dyslipidemia, diabetes mellitus, hypertension, male sex,  obesity and a sedentary lifestyle.  Thyroid Problem Presents for follow-up (He is on levothyroxine 250 mcg p.o. every morning.  His thyroid function tests are consistent with appropriate replacement.) visit. Patient reports no anxiety, constipation, diarrhea or fatigue. The symptoms have been stable. Past treatments include levothyroxine. His past medical history is significant for diabetes and hyperlipidemia.   Objective:    There were no vitals taken for this visit.  Wt Readings from Last 3 Encounters:  01/04/18 254 lb (115.2 kg)  07/24/17 286 lb (129.7 kg)  07/03/17 286 lb (129.7 kg)    Recent Results (from the past 2160 hour(s))  Hemoglobin A1c     Status: Abnormal   Collection Time: 01/06/19  8:02 AM  Result Value Ref Range   Hgb A1c MFr Bld 6.7 (H) <5.7 % of total Hgb    Comment: For someone without known diabetes, a hemoglobin A1c value of 6.5% or greater indicates that they may have  diabetes and this should be confirmed with a follow-up  test. . For someone with known diabetes, a value <7% indicates  that their diabetes is well controlled and a value  greater than or equal to 7% indicates suboptimal  control. A1c targets should be individualized based on  duration of diabetes, age, comorbid conditions, and  other considerations. . Currently,  no consensus exists regarding use of hemoglobin A1c for diagnosis of diabetes for children. .    Mean Plasma Glucose 146 (calc)   eAG (mmol/L) 8.1 (calc)  T4, free     Status: None   Collection Time: 01/06/19  8:02 AM  Result Value Ref Range   Free T4 1.2 0.8 - 1.8 ng/dL  TSH     Status: Abnormal   Collection Time: 01/06/19  8:02 AM  Result Value Ref Range   TSH 0.26 (L) 0.40 - 4.50 mIU/L  VITAMIN D 25 Hydroxy (Vit-D Deficiency, Fractures)     Status: None   Collection Time: 01/06/19  8:02 AM  Result Value Ref Range   Vit D, 25-Hydroxy 46 30 - 100 ng/mL    Comment: Vitamin D Status         25-OH Vitamin D: . Deficiency:                    <20 ng/mL Insufficiency:             20 - 29 ng/mL Optimal:                 > or = 30 ng/mL . For 25-OH Vitamin D testing on patients on  D2-supplementation and patients for whom quantitation  of D2 and D3 fractions is required, the [facility](TM) 25-OH VIT D, (D2,D3), LC/MS/MS is recommended: order  code [id number] (patients >78yrs). See Note 1 . Note 1 . For additional information, please refer to  http://education.[facility].com/faq/FAQ199  (This link is being provided for informational/ educational purposes only.)       Diabetic Labs (most recent): Lab Results  Component Value Date   HGBA1C 6.7 (H) 01/06/2019   HGBA1C 6.6 (H) 06/29/2018   HGBA1C 6.9 (H) 12/28/2017    Lipid Panel     Component Value Date/Time   CHOL 221 (H) 06/24/2017 0911   TRIG 324 (H) 06/24/2017 0911   HDL 27 (L) 06/24/2017 0911   CHOLHDL 8.2 (H) 06/24/2017 0911   LDLCALC 144 (H) 06/24/2017 0911     Assessment & Plan:   1. Diabetes mellitus with stage 4 chronic kidney disease ([facility])  His diabetes is  complicated by coronary artery disease status post CABG, recent stent placement, ESRD on dialysis, peripheral arterial disease, peripheral neuropathy.  -His previsit labs show A1c of 6.7%, overall improving from 9.7%.     Recent labs reviewed,  patient now on hemodialysis for ESRD. - Patient remains at a high risk for more acute and chronic complications of diabetes which include CAD, CVA, CKD, retinopathy, and neuropathy. These are all discussed in detail with the patient.  - I have re-counseled the patient on diet management and weight loss  by adopting a carbohydrate restricted / protein rich  Diet. - Patient is advised to stick to a routine mealtimes to eat 3 meals  a day and avoid unnecessary snacks ( to snack only to correct hypoglycemia).   - I have approached patient with the following individualized plan to manage diabetes and patient agrees.  -From diabetes point of view, he continues to do well with less medication.  He is advised to continue only on Victoza 1.5 mg subcutaneously daily.   -Patient is encouraged to call clinic for blood glucose levels less than 70 or above 200 mg /dl.  -Patient is not a candidate for metformin and SGLT2 inhibitors due to ESRD. -He is advised to maintain close follow up with his nephrologist.  - Patient specific target  for A1c; LDL, HDL, Triglycerides, and  Waist Circumference were discussed in detail.  2) BP/HTN: he is advised to home monitor blood pressure and report if > 140/90 on 2 separate readings.    He is advised to continue current medications including ARB.  3) Lipids/HPL: Uncontrolled lipid panel with LDL of 144, he is advised to continue pravastatin 80 mg p.o. nightly.    4)  Primary hypothyroidism  -His previsit labs show slight over replacement with levothyroxine.  I discussed and lowered his levothyroxine to 200 mcg daily before breakfast.     - We discussed about the correct intake of his thyroid hormone, on empty stomach at fasting, with water, separated by at least 30 minutes from breakfast and other medications,  and separated by more than 4 hours from calcium, iron, multivitamins, acid reflux medications (PPIs). -Patient is made aware of the fact that thyroid  hormone replacement is needed for life, dose to be adjusted by periodic monitoring of thyroid function tests.    5) Vitamin D deficiency -His vitamin D is better at 61.  He is advised to resume and continue vitamin D3 5000 units daily until next measurement in 6 months.    I advised patient to maintain close follow up with his PCP for primary care needs.  - Patient Care Time Today:  25 min, of which >50% was spent in  counseling and the rest reviewing his  current and  previous labs/studies, previous treatments, his blood glucose readings, and medications' doses and developing a plan for long-term care based on the latest recommendations for standards of care.   Cory Steele participated in the discussions, expressed understanding, and voiced agreement with the above plans.  All questions were answered to his satisfaction. he is encouraged to contact clinic should he have any questions or concerns prior to his return visit.  Follow up plan: Return in about 6 months (around 07/12/2019) for Bring Meter and Logs- A1c in Office.  Glade Lloyd, MD Phone: [phone]  Fax: [phone]  -  This note was partially dictated with voice recognition software. Similar sounding words can be transcribed inadequately or may not  be corrected  upon review.  01/11/2019, 4:16 PM

## 2019-02-07 ENCOUNTER — Other Ambulatory Visit: Payer: Self-pay | Admitting: "Endocrinology

## 2019-03-12 ENCOUNTER — Other Ambulatory Visit: Payer: Self-pay | Admitting: "Endocrinology

## 2019-03-15 MED ORDER — LEVOTHYROXINE SODIUM 200 MCG PO TABS: 200.0000 ug | ORAL_TABLET | Freq: Every day | ORAL | 1 refills | Status: DC

## 2019-03-22 ENCOUNTER — Telehealth: Payer: Self-pay | Admitting: "Endocrinology

## 2019-03-22 NOTE — Telephone Encounter (Signed)
Pt needs new RX on VICTOZA 18 MG/3ML SOPN

## 2019-03-23 ENCOUNTER — Other Ambulatory Visit: Payer: Self-pay | Admitting: "Endocrinology

## 2019-03-23 NOTE — Telephone Encounter (Signed)
Rx sent 

## 2019-04-06 ENCOUNTER — Other Ambulatory Visit: Payer: Self-pay | Admitting: "Endocrinology

## 2019-05-19 ENCOUNTER — Other Ambulatory Visit: Payer: Self-pay | Admitting: "Endocrinology

## 2019-06-09 ENCOUNTER — Other Ambulatory Visit: Payer: Self-pay | Admitting: "Endocrinology

## 2019-07-15 ENCOUNTER — Ambulatory Visit: Payer: Federal, State, Local not specified - PPO | Admitting: "Endocrinology

## 2019-07-19 ENCOUNTER — Encounter: Payer: Self-pay | Admitting: "Endocrinology

## 2019-07-19 ENCOUNTER — Other Ambulatory Visit: Payer: Self-pay

## 2019-07-19 ENCOUNTER — Ambulatory Visit (INDEPENDENT_AMBULATORY_CARE_PROVIDER_SITE_OTHER): Payer: Federal, State, Local not specified - PPO | Admitting: "Endocrinology

## 2019-07-19 VITALS — BP 149/71 | HR 71 | Ht 72.0 in | Wt 247.0 lb

## 2019-07-19 DIAGNOSIS — N185 Chronic kidney disease, stage 5: Secondary | ICD-10-CM | POA: Diagnosis not present

## 2019-07-19 DIAGNOSIS — E1122 Type 2 diabetes mellitus with diabetic chronic kidney disease: Secondary | ICD-10-CM

## 2019-07-19 LAB — POCT GLYCOSYLATED HEMOGLOBIN (HGB A1C): Hemoglobin A1C: 5.8 % — AB (ref 4.0–5.6)

## 2019-07-19 MED ORDER — VICTOZA 18 MG/3ML ~~LOC~~ SOPN: PEN_INJECTOR | SUBCUTANEOUS | 1 refills | Status: DC

## 2019-07-19 NOTE — Patient Instructions (Signed)

## 2019-07-19 NOTE — Progress Notes (Signed)
07/19/2019              Endocrinology follow-up note   Subjective:    Patient ID: Cory Steele, male    DOB: [birth year],    Past Medical History:  Diagnosis Date  . CAD (coronary artery disease)   . Diabetes mellitus, type II ([place])   . Hyperlipidemia   . Hypertension   . Hypothyroidism   . Kidney disease   . Obesity   . 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 CORONARY ARTERY BYPASS GRAFT     Social History   Socioeconomic History  . Marital status: Married    Spouse name: Not on file  . Number of children: Not on file  . Years of education: Not on file  . Highest education level: Not on file  Occupational History  . Not on file  Tobacco Use  . Smoking status: Current Every Day Smoker  . Smokeless tobacco: Never Used  Substance and Sexual Activity  . Alcohol use: No    Alcohol/week: 0.0 standard drinks  . Drug use: No  . Sexual activity: Not on file  Other Topics Concern  . Not on file  Social History Narrative  . Not on file   Social Determinants of Health   Financial Resource Strain:   . Difficulty of Paying Living Expenses:   Food Insecurity:   . Worried About Charity fundraiser in the Last Year:   . Arboriculturist in the Last Year:   Transportation Needs:   . Film/video editor (Medical):   Cory Steele Kitchen Lack of Transportation (Non-Medical):   Physical Activity:   . Days of Exercise per Week:   . Minutes of Exercise per Session:   Stress:   . Feeling of Stress :   Social Connections:   . Frequency of Communication with Friends and Family:   . Frequency of Social Gatherings with Friends and Family:   . Attends Religious Services:   . Active Member of Clubs or Organizations:   . Attends Archivist Meetings:   Cory Steele Kitchen Marital Status:    Outpatient Encounter Medications as of 07/19/2019  Medication Sig  . albuterol (PROVENTIL) (2.5 MG/3ML) 0.083%  nebulizer solution Inhale 3 mLs into the lungs every 6 (six) hours as needed.  Cory Steele Kitchen amitriptyline (ELAVIL) 50 MG tablet Take 50 mg by mouth 2 (two) times daily.  Cory Steele Kitchen amLODipine (NORVASC) 10 MG tablet Take 10 mg by mouth daily.  Cory Steele Kitchen aspirin 81 MG tablet Take by mouth daily.   . calcitRIOL (ROCALTROL) 0.25 MCG capsule Take 0.25 mcg by mouth daily.  . clopidogrel (PLAVIX) 75 MG tablet Take 75 mg by mouth daily.  . D-5000 125 MCG (5000 UT) TABS TAKE 1 TABLET (5,000 UNITS TOTAL) BY MOUTH DAILY.  . ferric citrate (AURYXIA) 1 GM 210 MG(Fe) tablet Take 420 mg by mouth 3 (three) times daily with meals.  . furosemide (LASIX) 40 MG tablet Take 40 mg by mouth 2 (two) times daily.  . Insulin Pen Needle (B-D ULTRAFINE III SHORT PEN) 31G X 8 MM MISC USE AS DIRECTED  . levothyroxine (SYNTHROID) 200 MCG tablet Take 1 tablet (200 mcg total) by mouth daily before breakfast.  . liraglutide (VICTOZA) 18 MG/3ML SOPN INJECT 1.2 MG UNDER SKIN ONCE DAILY  . metoprolol tartrate (LOPRESSOR) 100 MG tablet Take 100 mg by mouth 2 (two) times daily.  Cory Steele Kitchen omeprazole (  PRILOSEC) 20 MG capsule Take 20 mg by mouth daily.  . pravastatin (PRAVACHOL) 80 MG tablet Take 80 mg by mouth daily.   . pregabalin (LYRICA) 75 MG capsule Take 1 capsule (75 mg total) by mouth 2 (two) times daily.  . sodium bicarbonate 650 MG tablet Take 1,300 mg by mouth 2 (two) times daily.   . TRUE METRIX BLOOD GLUCOSE TEST test strip USE TO TEST BLOOD SUGAR 4 TIMES A DAY  . TRUEPLUS LANCETS 28G MISC USE AS DIRECTED 2 TIMES A DAY  . Vitamin D, Ergocalciferol, (DRISDOL) 50000 units CAPS capsule TAKE 1 CAPSULE BY MOUTH TWICE A WEEK  . [DISCONTINUED] VICTOZA 18 MG/3ML SOPN INJECT 1.8 MG UNDER SKIN ONCE DAILY   No facility-administered encounter medications on file as of 07/19/2019.   ALLERGIES: Allergies  Allergen Reactions  . Codeine   . Vancomycin    VACCINATION STATUS:  There is no immunization history on file for this patient.  Diabetes He presents for  his follow-up diabetic visit. He has type 2 diabetes mellitus. Onset time: He was diagnosed at approximate age of 39 years. His disease course has been improving (Worsening renal function.). There are no hypoglycemic associated symptoms. Pertinent negatives for hypoglycemia include no confusion, nervousness/anxiousness, pallor or seizures. Pertinent negatives for diabetes include no fatigue, no polydipsia, no polyphagia, no polyuria and no weakness. There are no hypoglycemic complications. Symptoms are improving. Diabetic complications include heart disease, nephropathy and peripheral neuropathy. (Now on hemodialysis.) Risk factors for coronary artery disease include diabetes mellitus, dyslipidemia, male sex, obesity and tobacco exposure. Current diabetic treatments: He is on Victoza 1.2 mg subcutaneously daily. He is compliant with treatment all of the time. His weight is decreasing steadily. He is following a generally unhealthy diet. When asked about meal planning, he reported none. He rarely participates in exercise. (His point-of-care A1c was 5.8% progressively improving.) An ACE inhibitor/angiotensin II receptor blocker is being taken. Eye exam is current.  Hyperlipidemia This is a chronic problem. The current episode started more than 1 year ago. The problem is uncontrolled. Exacerbating diseases include diabetes, hypothyroidism and obesity. Pertinent negatives include no myalgias. Current antihyperlipidemic treatment includes statins. Risk factors for coronary artery disease include dyslipidemia, diabetes mellitus, hypertension, male sex, obesity and a sedentary lifestyle.  Thyroid Problem Presents for follow-up (He is on levothyroxine 200 mcg p.o. every morning. ) visit. Patient reports no anxiety, constipation, diarrhea or fatigue. The symptoms have been stable. Past treatments include levothyroxine. His past medical history is significant for diabetes and hyperlipidemia.    Review of  systems  Constitutional: + Minimally fluctuating body weight,  current  Body mass index is 33.5 kg/m. , no fatigue, no subjective hyperthermia, no subjective hypothermia Eyes: no blurry vision, no xerophthalmia ENT: no sore throat, no nodules palpated in throat, no dysphagia/odynophagia, no hoarseness Cardiovascular: no Chest Pain, no Shortness of Breath, no palpitations, no leg swelling Respiratory: no cough, no shortness of breath Gastrointestinal: no Nausea/Vomiting/Diarhhea Musculoskeletal: no muscle/joint aches Skin: no rashes, no hyperemia Neurological: no tremors, no numbness, no tingling, no dizziness Psychiatric: no depression, no anxiety   Objective:    BP (!) 149/71   Pulse 71   Ht 6' (1.829 m)   Wt 247 lb (112 kg)   BMI 33.50 kg/m   Wt Readings from Last 3 Encounters:  07/19/19 247 lb (112 kg)  01/04/18 254 lb (115.2 kg)  07/24/17 286 lb (129.7 kg)     Physical Exam- Limited  Constitutional:  Body mass index  is 33.5 kg/m. , not in acute distress, normal state of mind Eyes:  EOMI, no exophthalmos Neck: Supple Thyroid: No gross goiter Respiratory: Adequate breathing efforts Musculoskeletal: no gross deformities, strength intact in all four extremities, no gross restriction of joint movements Skin:  no rashes, no hyperemia Neurological: no tremor with outstretched hands,   Recent Results (from the past 2160 hour(s))  HgB A1c     Status: Abnormal   Collection Time: 07/19/19 11:20 AM  Result Value Ref Range   Hemoglobin A1C 5.8 (A) 4.0 - 5.6 %   HbA1c POC (<> result, manual entry)     HbA1c, POC (prediabetic range)     HbA1c, POC (controlled diabetic range)       Diabetic Labs (most recent): Lab Results  Component Value Date   HGBA1C 5.8 (A) 07/19/2019   HGBA1C 6.7 (H) 01/06/2019   HGBA1C 6.6 (H) 06/29/2018    Lipid Panel     Component Value Date/Time   CHOL 221 (H) 06/24/2017 0911   TRIG 324 (H) 06/24/2017 0911   HDL 27 (L) 06/24/2017 0911    CHOLHDL 8.2 (H) 06/24/2017 0911   LDLCALC 144 (H) 06/24/2017 0911     Assessment & Plan:   1. Diabetes mellitus with stage 4 chronic kidney disease ([facility]) His diabetes is  complicated by coronary artery disease status post CABG, recent stent placement, ESRD on dialysis, peripheral arterial disease, peripheral neuropathy.  -His A1c is 5.8%, overall improving from 9.7.%.    Recent labs reviewed, patient now on hemodialysis for ESRD. - Patient remains at a high risk for more acute and chronic complications of diabetes which include CAD, CVA, CKD, retinopathy, and neuropathy. These are all discussed in detail with the patient.  - I have re-counseled the patient on diet management and weight loss  by adopting a carbohydrate restricted / protein rich  Diet.  -  Suggestion is made for him to avoid simple carbohydrates  from his diet including Cakes, Sweet Desserts / Pastries, Ice Cream, Soda (diet and regular), Sweet Tea, Candies, Chips, Cookies, Sweet Pastries,  Store Bought Juices, Alcohol in Excess of  1-2 drinks a day, Artificial Sweeteners, Coffee Creamer, and "Sugar-free" Products. This will help patient to have stable blood glucose profile and potentially avoid unintended weight gain.  - Patient is advised to stick to a routine mealtimes to eat 3 meals  a day and avoid unnecessary snacks ( to snack only to correct hypoglycemia).   - I have approached patient with the following individualized plan to manage diabetes and patient agrees.  -From diabetes point of view, he continues to do well with less medications.  He is advised to continue Metformin 1.2 mg subcutaneously daily.  -Patient is encouraged to call clinic for blood glucose levels less than 70 or above 200 mg /dl.  -Patient is not a candidate for metformin and SGLT2 inhibitors due to ESRD. -He is advised to maintain close follow up with his nephrologist.  - Patient specific target  for A1c; LDL, HDL, Triglycerides, and  Waist  Circumference were discussed in detail.  2) BP/HTN:  His blood pressure is above target.   He is advised to continue current medications including ARB.  3) Lipids/HPL: Uncontrolled lipid panel with LDL of 144, he is continued on pravastatin 80 mg p.o. nightly.    4)  Primary hypothyroidism  -He not have previous thyroid function test.  He is advised to continue levothyroxine 200 mcg daily before breakfast.     -  We discussed about the correct intake of his thyroid hormone, on empty stomach at fasting, with water, separated by at least 30 minutes from breakfast and other medications,  and separated by more than 4 hours from calcium, iron, multivitamins, acid reflux medications (PPIs). -Patient is made aware of the fact that thyroid hormone replacement is needed for life, dose to be adjusted by periodic monitoring of thyroid function tests.    5) Vitamin D deficiency -His 25 hydroxy vitamin D level was 46.   He is advised to resume and continue vitamin D3 5000 units daily until next measurement in 6 months.    I advised patient to maintain close follow up with his PCP for primary care needs.  - Time spent on this patient care encounter:  35 min, of which > 50% was spent in  counseling and the rest discussing his hypoglycemia and hyperglycemia episodes, reviewing his current and  previous labs / studies  ( including abstraction from other facilities) and medications  doses and developing a  long term treatment plan and documenting his care.   Please refer to Patient Instructions for Blood Glucose Monitoring and Insulin/Medications Dosing Guide"  in media tab for additional information. Please  also refer to " Patient Self Inventory" in the Media  tab for reviewed elements of pertinent patient history.  Cory Steele participated in the discussions, expressed understanding, and voiced agreement with the above plans.  All questions were answered to his satisfaction. he is encouraged to contact  clinic should he have any questions or concerns prior to his return visit.  Follow up plan: Return in about 6 months (around 01/18/2020) for Follow up with Pre-visit Labs, Next Visit A1c in Office.  Glade Lloyd, MD Phone: [phone]  Fax: [phone]  -  This note was partially dictated with voice recognition software. Similar sounding words can be transcribed inadequately or may not  be corrected upon review.  07/19/2019, 12:55 PM

## 2019-08-27 IMAGING — CR DG CHEST 1V PORT
1 series · 1 of 1 positions shown · non-contrast
Comparison: No recent prior.

CLINICAL DATA: Shortness of breath.

EXAM:
PORTABLE CHEST 1 VIEW

[portable]
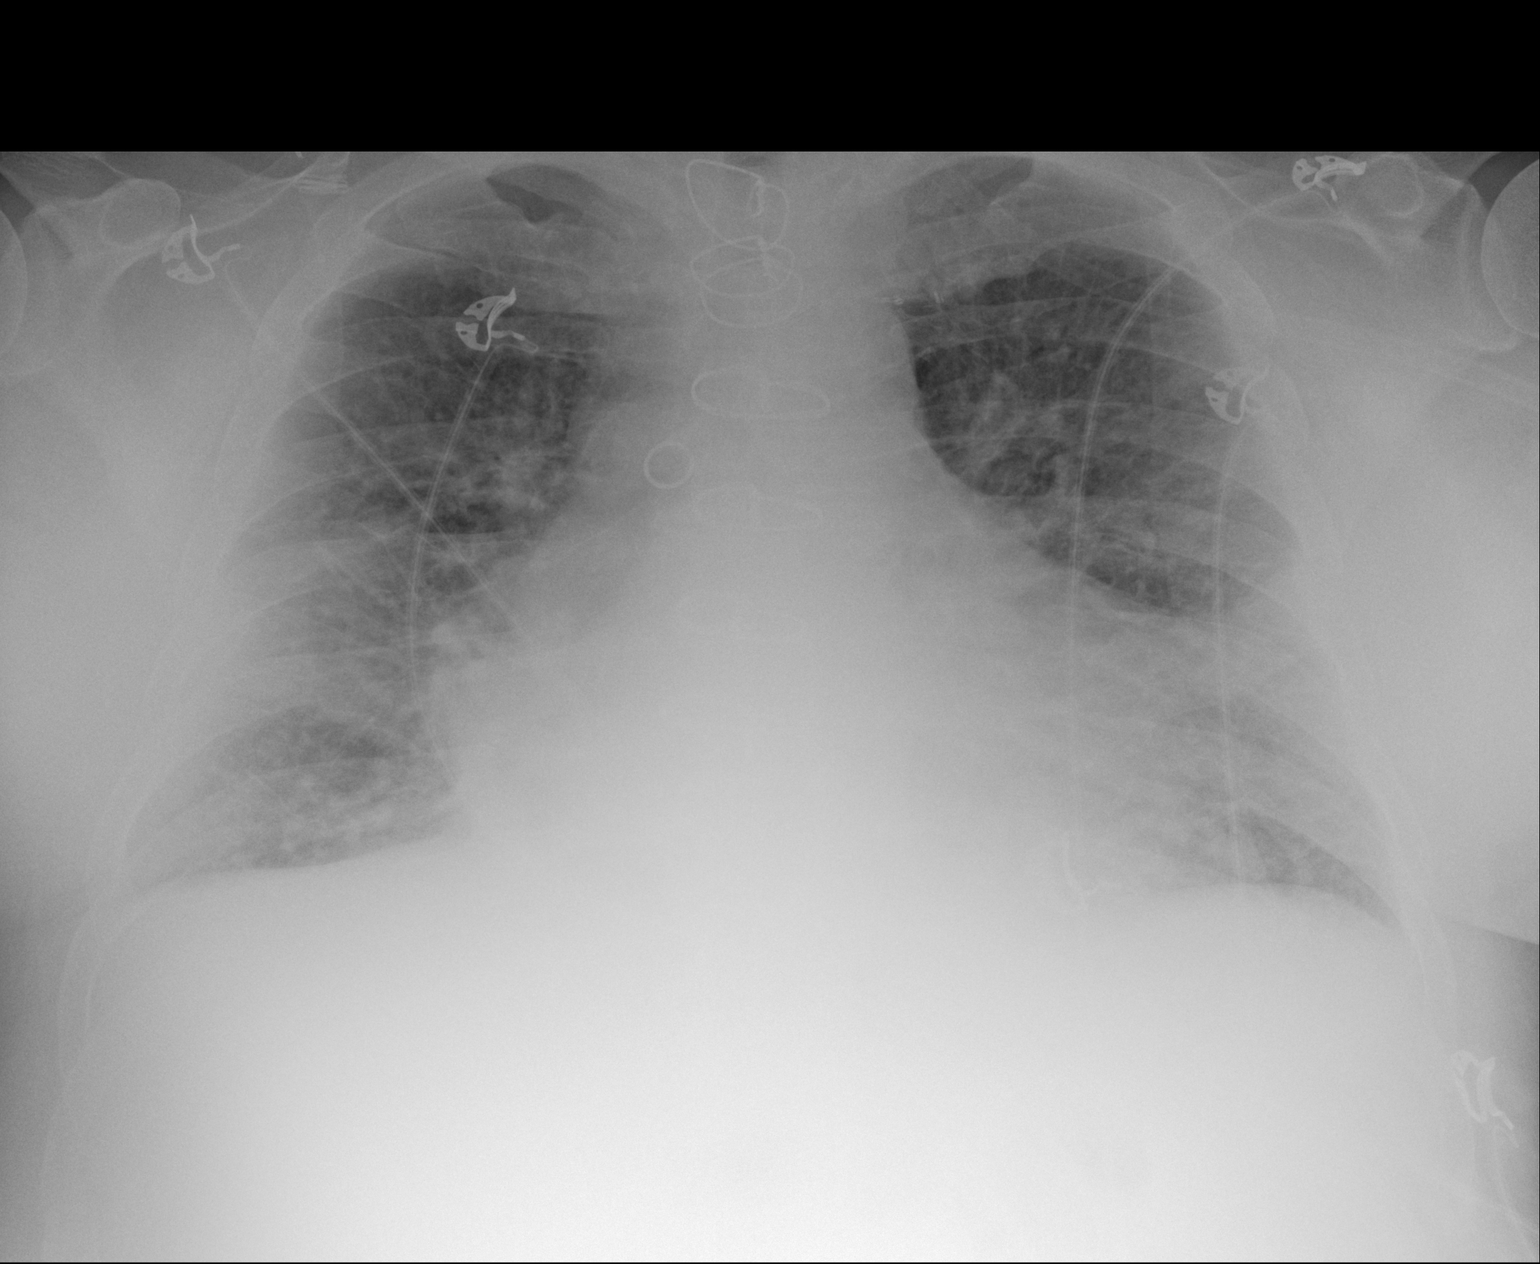

[1 of 1 positions shown; findings below may reference images not displayed]

FINDINGS: Prior CABG. Cardiomegaly with diffuse bilateral pulmonary
interstitial prominence. No pleural effusion or pneumothorax. No
acute bony abnormality.
IMPRESSION: Prior CABG. Congestive heart failure with bilateral pulmonary
interstitial edema. No acute abnormality identified.

## 2019-10-26 ENCOUNTER — Other Ambulatory Visit: Payer: Self-pay | Admitting: "Endocrinology

## 2019-10-27 ENCOUNTER — Other Ambulatory Visit: Payer: Self-pay | Admitting: "Endocrinology

## 2019-10-28 ENCOUNTER — Other Ambulatory Visit: Payer: Self-pay

## 2019-10-28 ENCOUNTER — Telehealth: Payer: Self-pay

## 2019-10-28 DIAGNOSIS — E1122 Type 2 diabetes mellitus with diabetic chronic kidney disease: Secondary | ICD-10-CM

## 2019-10-28 DIAGNOSIS — N185 Chronic kidney disease, stage 5: Secondary | ICD-10-CM

## 2019-10-28 MED ORDER — VICTOZA 18 MG/3ML ~~LOC~~ SOPN: PEN_INJECTOR | SUBCUTANEOUS | 1 refills | Status: DC

## 2019-10-28 NOTE — Telephone Encounter (Signed)
Pt LVM that he needs his victosa called in

## 2019-10-28 NOTE — Telephone Encounter (Signed)
done

## 2019-11-15 ENCOUNTER — Other Ambulatory Visit: Payer: Self-pay | Admitting: "Endocrinology

## 2019-11-15 DIAGNOSIS — N185 Chronic kidney disease, stage 5: Secondary | ICD-10-CM

## 2019-11-15 DIAGNOSIS — E1122 Type 2 diabetes mellitus with diabetic chronic kidney disease: Secondary | ICD-10-CM

## 2019-12-08 ENCOUNTER — Other Ambulatory Visit: Payer: Self-pay | Admitting: "Endocrinology

## 2019-12-20 ENCOUNTER — Other Ambulatory Visit: Payer: Self-pay | Admitting: "Endocrinology

## 2020-01-18 LAB — LIPID PANEL
Cholesterol: 131 mg/dL (ref <200)
HDL: 27 mg/dL — ABNORMAL LOW (ref >=40)
LDL Cholesterol (Calc): 64 mg/dL (calc)
Non-HDL Cholesterol (Calc): 104 mg/dL (calc) (ref <130)
Total CHOL/HDL Ratio: 4.9 (calc) (ref <5.0)
Triglycerides: 328 mg/dL — ABNORMAL HIGH (ref <150)

## 2020-01-18 LAB — TSH: TSH: 0.41 mIU/L (ref 0.40–4.50)

## 2020-01-18 LAB — T4, FREE: Free T4: 1.6 ng/dL (ref 0.8–1.8)

## 2020-01-18 LAB — VITAMIN D 25 HYDROXY (VIT D DEFICIENCY, FRACTURES): Vit D, 25-Hydroxy: 71 ng/mL (ref 30–100)

## 2020-01-19 ENCOUNTER — Other Ambulatory Visit: Payer: Self-pay

## 2020-01-19 ENCOUNTER — Ambulatory Visit (INDEPENDENT_AMBULATORY_CARE_PROVIDER_SITE_OTHER): Payer: Federal, State, Local not specified - PPO | Admitting: Nurse Practitioner

## 2020-01-19 ENCOUNTER — Encounter: Payer: Self-pay | Admitting: Nurse Practitioner

## 2020-01-19 VITALS — BP 163/65 | HR 75 | Ht 72.0 in | Wt 247.4 lb

## 2020-01-19 DIAGNOSIS — N185 Chronic kidney disease, stage 5: Secondary | ICD-10-CM

## 2020-01-19 DIAGNOSIS — E039 Hypothyroidism, unspecified: Secondary | ICD-10-CM

## 2020-01-19 DIAGNOSIS — E782 Mixed hyperlipidemia: Secondary | ICD-10-CM | POA: Diagnosis not present

## 2020-01-19 DIAGNOSIS — E1122 Type 2 diabetes mellitus with diabetic chronic kidney disease: Secondary | ICD-10-CM | POA: Diagnosis not present

## 2020-01-19 LAB — POCT GLYCOSYLATED HEMOGLOBIN (HGB A1C): Hemoglobin A1C: 6.4 % — AB (ref 4.0–5.6)

## 2020-01-19 NOTE — Progress Notes (Signed)
01/19/2020              Endocrinology follow-up note   Subjective:    Patient ID: Cory Steele, male    DOB: 09-03-1950,    Past Medical History:  Diagnosis Date  . CAD (coronary artery disease)   . Diabetes mellitus, type II ([place])   . Hyperlipidemia   . Hypertension   . Hypothyroidism   . Kidney disease   . Obesity   . 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 CORONARY ARTERY BYPASS GRAFT     Social History   Socioeconomic History  . Marital status: Married    Spouse name: Not on file  . Number of children: Not on file  . Years of education: Not on file  . Highest education level: Not on file  Occupational History  . Not on file  Tobacco Use  . Smoking status: Current Every Day Smoker  . Smokeless tobacco: Never Used  Vaping Use  . Vaping Use: Never used  Substance and Sexual Activity  . Alcohol use: No    Alcohol/week: 0.0 standard drinks  . Drug use: No  . Sexual activity: Not on file  Other Topics Concern  . Not on file  Social History Narrative  . Not on file   Social Determinants of Health   Financial Resource Strain:   . Difficulty of Paying Living Expenses: Not on file  Food Insecurity:   . Worried About Charity fundraiser in the Last Year: Not on file  . Ran Out of Food in the Last Year: Not on file  Transportation Needs:   . Lack of Transportation (Medical): Not on file  . Lack of Transportation (Non-Medical): Not on file  Physical Activity:   . Days of Exercise per Week: Not on file  . Minutes of Exercise per Session: Not on file  Stress:   . Feeling of Stress : Not on file  Social Connections:   . Frequency of Communication with Friends and Family: Not on file  . Frequency of Social Gatherings with Friends and Family: Not on file  . Attends Religious Services: Not on file  . Active Member of Clubs or Organizations: Not on file  .  Attends Archivist Meetings: Not on file  . Marital Status: Not on file   Outpatient Encounter Medications as of 01/19/2020  Medication Sig  . albuterol (PROVENTIL) (2.5 MG/3ML) 0.083% nebulizer solution Inhale 3 mLs into the lungs every 6 (six) hours as needed.  Marland Kitchen amLODipine (NORVASC) 10 MG tablet Take 10 mg by mouth daily.  Marland Kitchen ascorbic acid (VITAMIN C) 1000 MG tablet Take by mouth.  Marland Kitchen aspirin 81 MG tablet Take by mouth daily.   . betamethasone dipropionate (DIPROLENE) 0.05 % ointment Apply 1 application topically 2 (two) times daily.  Marland Kitchen BROMSITE 0.075 % SOLN Apply 1 drop to eye 2 (two) times daily.  . budesonide-formoterol (SYMBICORT) 160-4.5 MCG/ACT inhaler Inhale into the lungs.  . calcitRIOL (ROCALTROL) 0.25 MCG capsule Take 0.25 mcg by mouth daily.  . carvedilol (COREG) 6.25 MG tablet Take 6.25 mg by mouth 2 (two) times daily.  . clopidogrel (PLAVIX) 75 MG tablet Take 75 mg by mouth daily.  . D-5000 125 MCG (5000 UT) TABS TAKE 1 TABLET (5,000 UNITS TOTAL) BY MOUTH DAILY.  . ferric citrate (AURYXIA) 1 GM 210 MG(Fe) tablet Take 420 mg by mouth 3 (  three) times daily with meals.  . fluocinonide (LIDEX) 0.05 % external solution Apply topically.  . furosemide (LASIX) 40 MG tablet Take 40 mg by mouth 2 (two) times daily.  Marland Kitchen glipiZIDE (GLUCOTROL) 10 MG tablet Take by mouth.  . Insulin Pen Needle (B-D ULTRAFINE III SHORT PEN) 31G X 8 MM MISC USE AS DIRECTED  . lanthanum (FOSRENOL) 1000 MG chewable tablet Chew 1,000 mg by mouth 3 (three) times daily.  Marland Kitchen levothyroxine (SYNTHROID) 200 MCG tablet TAKE 1 TABLET BY MOUTH DAILY BEFORE BREAKFAST  . liraglutide (VICTOZA) 18 MG/3ML SOPN INJECT 1.8 MG UNDER SKIN ONCE DAILY  . magnesium oxide (MAG-OX) 400 MG tablet Take 1 tablet by mouth daily.  . metolazone (ZAROXOLYN) 2.5 MG tablet Take by mouth.  . metoprolol tartrate (LOPRESSOR) 100 MG tablet Take 100 mg by mouth 2 (two) times daily.  Marland Kitchen omeprazole (PRILOSEC) 20 MG capsule Take 20 mg by mouth  daily.  . pregabalin (LYRICA) 75 MG capsule Take 1 capsule (75 mg total) by mouth 2 (two) times daily.  . rosuvastatin (CRESTOR) 20 MG tablet Take 20 mg by mouth daily.  . SKYRIZI PEN 150 MG/ML SOAJ   . sodium bicarbonate 650 MG tablet Take 1,300 mg by mouth 2 (two) times daily.   Marland Kitchen SPIRIVA RESPIMAT 2.5 MCG/ACT AERS SMARTSIG:2 Puff(s) Via Inhaler Daily  . trazodone (DESYREL) 300 MG tablet Take 300 mg by mouth at bedtime.  . TRUE METRIX BLOOD GLUCOSE TEST test strip USE TO TEST BLOOD SUGAR 4 TIMES A DAY  . TRUEPLUS LANCETS 28G MISC USE AS DIRECTED 2 TIMES A DAY  . Vitamin D, Ergocalciferol, (DRISDOL) 50000 units CAPS capsule TAKE 1 CAPSULE BY MOUTH TWICE A WEEK  . [DISCONTINUED] amitriptyline (ELAVIL) 50 MG tablet Take 50 mg by mouth 2 (two) times daily.  . [DISCONTINUED] pravastatin (PRAVACHOL) 80 MG tablet Take 80 mg by mouth daily.    No facility-administered encounter medications on file as of 01/19/2020.   ALLERGIES: Allergies  Allergen Reactions  . Codeine   . Vancomycin    VACCINATION STATUS:  There is no immunization history on file for this patient.  Diabetes He presents for his follow-up diabetic visit. He has type 2 diabetes mellitus. Onset time: He was diagnosed at approximate age of 66 years. His disease course has been worsening (Worsening renal function- now on HD). There are no hypoglycemic associated symptoms. Pertinent negatives for hypoglycemia include no confusion, nervousness/anxiousness, pallor, seizures or tremors. Pertinent negatives for diabetes include no fatigue, no polydipsia, no polyphagia, no polyuria, no weakness and no weight loss. There are no hypoglycemic complications. Symptoms are stable. Diabetic complications include heart disease, nephropathy and peripheral neuropathy. (Now on hemodialysis.) Risk factors for coronary artery disease include diabetes mellitus, dyslipidemia, male sex, obesity, tobacco exposure, hypertension and sedentary lifestyle. Current  diabetic treatments: He is on Victoza 1.2 mg subcutaneously daily. He is compliant with treatment all of the time. His weight is stable. He is following a generally unhealthy diet. When asked about meal planning, he reported none. He has not had a previous visit with a dietitian. He rarely participates in exercise. (He presents today without meter or logs to review.  He does not routinely monitor glucose at home due to regimen that consists only of Victoza 1.2 mg SQ daily.  His POCT A1C today is 6.4%, increasing from last visit of 5.8%, yet still an appropriate target A1C for him.  He denies any s/s of hypoglycemia.) An ACE inhibitor/angiotensin II receptor blocker is not  being taken. He does not see a podiatrist.Eye exam is current.  Hyperlipidemia This is a chronic problem. The current episode started more than 1 year ago. The problem is uncontrolled. Recent lipid tests were reviewed and are variable. Exacerbating diseases include chronic renal disease, diabetes, hypothyroidism and obesity. Factors aggravating his hyperlipidemia include beta blockers and fatty foods. Pertinent negatives include no myalgias. Current antihyperlipidemic treatment includes statins. The current treatment provides moderate improvement of lipids. Compliance problems include adherence to diet and adherence to exercise.  Risk factors for coronary artery disease include dyslipidemia, diabetes mellitus, hypertension, male sex, obesity and a sedentary lifestyle.  Thyroid Problem Presents for follow-up (He is on levothyroxine 200 mcg p.o. every morning. ) visit. Patient reports no anxiety, constipation, diarrhea, fatigue, palpitations, tremors, weight gain or weight loss. The symptoms have been stable. Past treatments include levothyroxine. His past medical history is significant for diabetes and hyperlipidemia.    Review of systems  Constitutional: + Minimally fluctuating body weight,  current Body mass index is 33.55 kg/m. , no  fatigue, no subjective hyperthermia, no subjective hypothermia Eyes: no blurry vision, no xerophthalmia ENT: no sore throat, no nodules palpated in throat, no dysphagia/odynophagia, no hoarseness Cardiovascular: no chest pain, no shortness of breath, no palpitations, no leg swelling Respiratory: no cough, no shortness of breath Gastrointestinal: no nausea/vomiting/diarrhea Musculoskeletal: no muscle/joint aches Skin: no rashes, no hyperemia Neurological: no tremors, no numbness, no tingling, no dizziness Psychiatric: no depression, no anxiety  Objective:    BP (!) 163/65 (BP Location: Right Arm, Patient Position: Sitting)   Pulse 75   Ht 6' (1.829 m)   Wt 247 lb 6.4 oz (112.2 kg)   BMI 33.55 kg/m   Wt Readings from Last 3 Encounters:  01/19/20 247 lb 6.4 oz (112.2 kg)  07/19/19 247 lb (112 kg)  01/04/18 254 lb (115.2 kg)    BP Readings from Last 3 Encounters:  01/19/20 (!) 163/65  07/19/19 (!) 149/71  01/04/18 128/69    Physical Exam- Limited  Constitutional:  Body mass index is 33.55 kg/m. , not in acute distress, normal state of mind Eyes:  EOMI, no exophthalmos Neck: Supple Thyroid: No gross goiter Cardiovascular: RRR, no murmers, rubs, or gallops, no edema Respiratory: Adequate breathing efforts, no crackles, rales, rhonchi, or wheezing Musculoskeletal: no gross deformities, strength intact in all four extremities, no gross restriction of joint movements Skin:  no rashes, no hyperemia Neurological: no tremor with outstretched hands    Recent Results (from the past 2160 hour(s))  TSH     Status: None   Collection Time: 01/17/20  8:15 AM  Result Value Ref Range   TSH 0.41 0.40 - 4.50 mIU/L  T4, free     Status: None   Collection Time: 01/17/20  8:15 AM  Result Value Ref Range   Free T4 1.6 0.8 - 1.8 ng/dL  Lipid panel     Status: Abnormal   Collection Time: 01/17/20  8:15 AM  Result Value Ref Range   Cholesterol 131 <200 mg/dL   HDL 27 (L) > OR = 40 mg/dL    Triglycerides 328 (H) <150 mg/dL    Comment: . If a non-fasting specimen was collected, consider repeat triglyceride testing on a fasting specimen if clinically indicated.  Yates Decamp et al. J. of Clin. Lipidol. 9509;3:267-124. Marland Kitchen    LDL Cholesterol (Calc) 64 mg/dL (calc)    Comment: Reference range: <100 . Desirable range <100 mg/dL for primary prevention;   <70 mg/dL for patients with  CHD or diabetic patients  with > or = 2 CHD risk factors. Marland Kitchen LDL-C is now calculated using the Martin-Hopkins  calculation, which is a validated novel method providing  better accuracy than the Friedewald equation in the  estimation of LDL-C.  Cresenciano Genre et al. Annamaria Helling. 0960;454(09): 2061-2068  (http://education.[facility].com/faq/FAQ164)    Total CHOL/HDL Ratio 4.9 <5.0 (calc)   Non-HDL Cholesterol (Calc) 104 <130 mg/dL (calc)    Comment: For patients with diabetes plus 1 major ASCVD risk  factor, treating to a non-HDL-C goal of <100 mg/dL  (LDL-C of <70 mg/dL) is considered a therapeutic  option.   VITAMIN D 25 Hydroxy (Vit-D Deficiency, Fractures)     Status: None   Collection Time: 01/17/20  8:15 AM  Result Value Ref Range   Vit D, 25-Hydroxy 71 30 - 100 ng/mL    Comment: Vitamin D Status         25-OH Vitamin D: . Deficiency:                    <20 ng/mL Insufficiency:             20 - 29 ng/mL Optimal:                 > or = 30 ng/mL . For 25-OH Vitamin D testing on patients on  D2-supplementation and patients for whom quantitation  of D2 and D3 fractions is required, the [facility]ureD(TM) 25-OH VIT D, (D2,D3), LC/MS/MS is recommended: order  code [id number] (patients >27yrs). See Note 1 . Note 1 . For additional information, please refer to  http://education.[facility].com/faq/FAQ199  (This link is being provided for informational/ educational purposes only.)      Diabetic Labs (most recent): Lab Results  Component Value Date   HGBA1C 5.8 (A) 07/19/2019   HGBA1C 6.7  (H) 01/06/2019   HGBA1C 6.6 (H) 06/29/2018    Lipid Panel     Component Value Date/Time   CHOL 131 01/17/2020 0815   TRIG 328 (H) 01/17/2020 0815   HDL 27 (L) 01/17/2020 0815   CHOLHDL 4.9 01/17/2020 0815   LDLCALC 64 01/17/2020 0815     Assessment & Plan:   1) Diabetes mellitus with ESRD ([place]) His diabetes is  complicated by coronary artery disease status post CABG, recent stent placement, ESRD on dialysis, peripheral arterial disease, peripheral neuropathy.  He presents today without meter or logs to review.  He does not routinely monitor glucose at home due to regimen that consists only of Victoza 1.2 mg SQ daily.  His POCT A1C today is 6.4%, increasing from last visit of 5.8%, yet still an appropriate target A1C for him.  He denies any s/s of hypoglycemia.  - Recent labs reviewed, patient now on hemodialysis for ESRD.  - Patient remains at a high risk for more acute and chronic complications of diabetes which include CAD, CVA, CKD, retinopathy, and neuropathy. These are all discussed in detail with the patient.  - Nutritional counseling repeated at each appointment due to patients tendency to fall back in to old habits.  - The patient admits there is a room for improvement in their diet and drink choices. -  Suggestion is made for the patient to avoid simple carbohydrates from their diet including Cakes, Sweet Desserts / Pastries, Ice Cream, Soda (diet and regular), Sweet Tea, Candies, Chips, Cookies, Sweet Pastries,  Store Bought Juices, Alcohol in Excess of  1-2 drinks a day, Artificial Sweeteners, Coffee Creamer, and "Sugar-free" Products. This will help  patient to have stable blood glucose profile and potentially avoid unintended weight gain.   - I encouraged the patient to switch to  unprocessed or minimally processed complex starch and increased protein intake (animal or plant source), fruits, and vegetables.   - Patient is advised to stick to a routine mealtimes to eat 3  meals  a day and avoid unnecessary snacks ( to snack only to correct hypoglycemia).  - I have approached patient with the following individualized plan to manage diabetes and patient agrees.  -From diabetes point of view, he continues to do well with less medications.  He is advised to continue Victoza 1.2 mg subcutaneously daily.   -Patient is not a candidate for metformin and SGLT2 inhibitors due to ESRD.  -He is advised to maintain close follow up with his nephrologist.  - Patient specific target  for A1c; LDL, HDL, Triglycerides, and  Waist Circumference were discussed in detail.  2) BP/HTN:  His blood pressure is not controlled to target.  He is advised to continue Norvasc 10 mg po daily, Coreg 6.25 mg po twice daily, Lasix 40 mg po twice daily, and Metoprolol 100 mg po twice daily.  Will defer any med changes to nephrologist.  3) Lipids/HPL:  His most recent lipid panel from 01/17/20 shows improved LDL of 64 and worsening triglycerides of 328.  He is advised to continue Crestor 20 mg po daily before bed.  Side effects and precautions discussed with him.  He is advised to start OTC Fish Oil supplement for help with triglycerides as well as avoiding fried foods and butter.  4) Primary hypothyroidism -His previsit TFTs are consistent with appropriate hormone replacement.  He is advised to continue Levothyroxine 200 mcg po daily before breakfast.   - We discussed about the correct intake of his thyroid hormone, on empty stomach at fasting, with water, separated by at least 30 minutes from breakfast and other medications,  and separated by more than 4 hours from calcium, iron, multivitamins, acid reflux medications (PPIs). -Patient is made aware of the fact that thyroid hormone replacement is needed for life, dose to be adjusted by periodic monitoring of thyroid function tests.   5) Vitamin D deficiency -His most recent vitamin D level was 71, improving from previous visit.  He is advised  to continue OTC Vitamin D 3 5000 units daily as maintenance dose.   I advised patient to maintain close follow up with his PCP for primary care needs.  - Time spent on this patient care encounter:  35 min, of which > 50% was spent in  counseling and the rest reviewing his blood glucose logs , discussing his hypoglycemia and hyperglycemia episodes, reviewing his current and  previous labs / studies  ( including abstraction from other facilities) and medications  doses and developing a  long term treatment plan and documenting his care.   Please refer to Patient Instructions for Blood Glucose Monitoring and Insulin/Medications Dosing Guide"  in media tab for additional information. Please  also refer to " Patient Self Inventory" in the Media  tab for reviewed elements of pertinent patient history.  Cory Steele participated in the discussions, expressed understanding, and voiced agreement with the above plans.  All questions were answered to his satisfaction. he is encouraged to contact clinic should he have any questions or concerns prior to his return visit.  Follow up plan: Return in about 6 months (around 07/19/2020) for Diabetes follow up- A1c and urine micro in office, Thyroid  follow up, Previsit labs.  Rayetta Pigg, [facility] [address] [place],  [address] Phone: [phone] Fax: [phone]  01/19/2020, 11:03 AM

## 2020-01-19 NOTE — Patient Instructions (Signed)

## 2020-01-29 ENCOUNTER — Other Ambulatory Visit: Payer: Self-pay | Admitting: "Endocrinology

## 2020-01-29 DIAGNOSIS — N185 Chronic kidney disease, stage 5: Secondary | ICD-10-CM

## 2020-01-29 DIAGNOSIS — E1122 Type 2 diabetes mellitus with diabetic chronic kidney disease: Secondary | ICD-10-CM

## 2020-01-30 NOTE — Telephone Encounter (Signed)
Office notes states victoza 1.$RemoveBeforeDE'2mg'[id number]$  , he has been getting victoza 1.$RemoveBeforeDEI'8mg'[id number]$ , please advise.

## 2020-02-21 ENCOUNTER — Other Ambulatory Visit: Payer: Self-pay | Admitting: "Endocrinology

## 2020-03-10 ENCOUNTER — Other Ambulatory Visit: Payer: Self-pay | Admitting: "Endocrinology

## 2020-05-08 ENCOUNTER — Other Ambulatory Visit: Payer: Self-pay | Admitting: Nurse Practitioner

## 2020-06-02 ENCOUNTER — Other Ambulatory Visit: Payer: Self-pay | Admitting: Nurse Practitioner

## 2020-07-05 ENCOUNTER — Other Ambulatory Visit: Payer: Self-pay | Admitting: Nurse Practitioner

## 2020-07-11 LAB — TSH: TSH: 1.14 u[IU]/mL (ref 0.450–4.500)

## 2020-07-11 LAB — T4, FREE: Free T4: 1.65 ng/dL (ref 0.82–1.77)

## 2020-07-19 ENCOUNTER — Other Ambulatory Visit: Payer: Self-pay

## 2020-07-19 ENCOUNTER — Encounter: Payer: Self-pay | Admitting: Nurse Practitioner

## 2020-07-19 ENCOUNTER — Ambulatory Visit (INDEPENDENT_AMBULATORY_CARE_PROVIDER_SITE_OTHER): Payer: Medicare Other | Admitting: Nurse Practitioner

## 2020-07-19 VITALS — BP 144/59 | HR 72 | Ht 72.0 in | Wt 250.0 lb

## 2020-07-19 DIAGNOSIS — N185 Chronic kidney disease, stage 5: Secondary | ICD-10-CM | POA: Diagnosis not present

## 2020-07-19 DIAGNOSIS — E782 Mixed hyperlipidemia: Secondary | ICD-10-CM | POA: Diagnosis not present

## 2020-07-19 DIAGNOSIS — E039 Hypothyroidism, unspecified: Secondary | ICD-10-CM | POA: Diagnosis not present

## 2020-07-19 DIAGNOSIS — E1122 Type 2 diabetes mellitus with diabetic chronic kidney disease: Secondary | ICD-10-CM

## 2020-07-19 LAB — POCT GLYCOSYLATED HEMOGLOBIN (HGB A1C): HbA1c, POC (controlled diabetic range): 6.7 % (ref 0.0–7.0)

## 2020-07-19 NOTE — Progress Notes (Signed)
07/19/2020              Endocrinology follow-up note   Subjective:    Patient ID: Cory Steele, male    DOB: 27-Nov-1950,    Past Medical History:  Diagnosis Date  . CAD (coronary artery disease)   . Diabetes mellitus, type II ([place])   . Hyperlipidemia   . Hypertension   . Hypothyroidism   . Kidney disease   . Obesity   . Uncontrolled type 2 diabetes mellitus with stage 5 chronic kidney disease ([place])    Past Surgical History:  Procedure Laterality Date  . CORONARY ARTERY BYPASS GRAFT     Social History   Socioeconomic History  . Marital status: Married    Spouse name: Not on file  . Number of children: Not on file  . Years of education: Not on file  . Highest education level: Not on file  Occupational History  . Not on file  Tobacco Use  . Smoking status: Current Every Day Smoker  . Smokeless tobacco: Never Used  Vaping Use  . Vaping Use: Never used  Substance and Sexual Activity  . Alcohol use: No    Alcohol/week: 0.0 standard drinks  . Drug use: No  . Sexual activity: Not on file  Other Topics Concern  . Not on file  Social History Narrative  . Not on file   Social Determinants of Health   Financial Resource Strain: Not on file  Food Insecurity: Not on file  Transportation Needs: Not on file  Physical Activity: Not on file  Stress: Not on file  Social Connections: Not on file   Outpatient Encounter Medications as of 07/19/2020  Medication Sig  . albuterol (PROVENTIL) (2.5 MG/3ML) 0.083% nebulizer solution Inhale 3 mLs into the lungs every 6 (six) hours as needed.  Marland Kitchen amLODipine (NORVASC) 10 MG tablet Take 10 mg by mouth daily.  Marland Kitchen ascorbic acid (VITAMIN C) 1000 MG tablet Take by mouth.  Marland Kitchen aspirin 81 MG tablet Take by mouth daily.   . betamethasone dipropionate (DIPROLENE) 0.05 % ointment Apply 1 application topically 2 (two) times daily.  Marland Kitchen BROMSITE 0.075 % SOLN Apply 1 drop to eye 2 (two) times daily.  . budesonide-formoterol (SYMBICORT) 160-4.5  MCG/ACT inhaler Inhale into the lungs.  . calcitRIOL (ROCALTROL) 0.25 MCG capsule Take 0.25 mcg by mouth daily.  . carvedilol (COREG) 6.25 MG tablet Take 6.25 mg by mouth 2 (two) times daily.  . clopidogrel (PLAVIX) 75 MG tablet Take 75 mg by mouth daily.  . D-5000 125 MCG (5000 UT) TABS TAKE 1 TABLET BY MOUTH DAILY  . ferric citrate (AURYXIA) 1 GM 210 MG(Fe) tablet Take 420 mg by mouth 3 (three) times daily with meals.  . fluocinonide (LIDEX) 0.05 % external solution Apply topically.  . furosemide (LASIX) 40 MG tablet Take 40 mg by mouth 2 (two) times daily.  Marland Kitchen glipiZIDE (GLUCOTROL) 10 MG tablet Take by mouth.  . Insulin Pen Needle (B-D ULTRAFINE III SHORT PEN) 31G X 8 MM MISC USE AS DIRECTED  . lanthanum (FOSRENOL) 1000 MG chewable tablet Chew 1,000 mg by mouth 3 (three) times daily.  Marland Kitchen levothyroxine (SYNTHROID) 200 MCG tablet TAKE 1 TABLET BY MOUTH EVERY DAY BEFORE BREAKFAST  . liraglutide (VICTOZA) 18 MG/3ML SOPN Inject 1.2 mg into the skin daily. INJECT 1.8 MG UNDER SKIN ONCE DAILY  . magnesium oxide (MAG-OX) 400 MG tablet Take 1 tablet by mouth daily.  . metolazone (ZAROXOLYN) 2.5 MG tablet Take by mouth.  Marland Kitchen  metoprolol tartrate (LOPRESSOR) 100 MG tablet Take 100 mg by mouth 2 (two) times daily.  Marland Kitchen omeprazole (PRILOSEC) 20 MG capsule Take 20 mg by mouth daily.  . pregabalin (LYRICA) 75 MG capsule Take 1 capsule (75 mg total) by mouth 2 (two) times daily.  . rosuvastatin (CRESTOR) 20 MG tablet Take 20 mg by mouth daily.  . SKYRIZI PEN 150 MG/ML SOAJ   . sodium bicarbonate 650 MG tablet Take 1,300 mg by mouth 2 (two) times daily.   Marland Kitchen SPIRIVA RESPIMAT 2.5 MCG/ACT AERS SMARTSIG:2 Puff(s) Via Inhaler Daily  . trazodone (DESYREL) 300 MG tablet Take 300 mg by mouth at bedtime.  . TRUE METRIX BLOOD GLUCOSE TEST test strip USE TO TEST BLOOD SUGAR 4 TIMES A DAY  . TRUEPLUS LANCETS 28G MISC USE AS DIRECTED 2 TIMES A DAY  . Vitamin D, Ergocalciferol, (DRISDOL) 50000 units CAPS capsule TAKE 1  CAPSULE BY MOUTH TWICE A WEEK   No facility-administered encounter medications on file as of 07/19/2020.   ALLERGIES: Allergies  Allergen Reactions  . Codeine   . Vancomycin    VACCINATION STATUS:  There is no immunization history on file for this patient.  Diabetes He presents for his follow-up diabetic visit. He has type 2 diabetes mellitus. Onset time: He was diagnosed at approximate age of 12 years. His disease course has been fluctuating (Worsening renal function- now on HD). There are no hypoglycemic associated symptoms. Pertinent negatives for hypoglycemia include no confusion, nervousness/anxiousness, pallor, seizures or tremors. Pertinent negatives for diabetes include no fatigue, no polydipsia, no polyphagia, no polyuria, no weakness and no weight loss. There are no hypoglycemic complications. Symptoms are stable. Diabetic complications include heart disease, nephropathy, peripheral neuropathy and PVD. (Now on hemodialysis.) Risk factors for coronary artery disease include diabetes mellitus, dyslipidemia, male sex, obesity, tobacco exposure, hypertension and sedentary lifestyle. Current diabetic treatments: He is on Victoza 1.2 mg subcutaneously daily. He is compliant with treatment all of the time. His weight is fluctuating minimally. He is following a generally unhealthy diet. When asked about meal planning, he reported none. He has not had a previous visit with a dietitian. He rarely participates in exercise. (He presents today with no meter or logs to review.  He does not monitor glucose routinely due to Victoza only.  He does note that he gets injections in bilateral knees every 3 months or so which makes his sugar higher.  He had some left over Basaglar at home and would take 10 units when his glucose was over 300 for about 1 week until his glucose decreased back to normal.  He denies any episodes of hypoglycemia.) An ACE inhibitor/angiotensin II receptor blocker is not being taken. He  does not see a podiatrist.Eye exam is current.  Hyperlipidemia This is a chronic problem. The current episode started more than 1 year ago. The problem is uncontrolled. Recent lipid tests were reviewed and are variable. Exacerbating diseases include chronic renal disease, diabetes, hypothyroidism and obesity. Factors aggravating his hyperlipidemia include beta blockers and fatty foods. Pertinent negatives include no myalgias. Current antihyperlipidemic treatment includes statins. The current treatment provides moderate improvement of lipids. Compliance problems include adherence to diet and adherence to exercise.  Risk factors for coronary artery disease include dyslipidemia, diabetes mellitus, hypertension, male sex, obesity and a sedentary lifestyle.  Thyroid Problem Presents for follow-up (He is on levothyroxine 200 mcg p.o. every morning. ) visit. Patient reports no anxiety, constipation, diarrhea, fatigue, palpitations, tremors, weight gain or weight loss. The symptoms  have been stable. Past treatments include levothyroxine. His past medical history is significant for diabetes and hyperlipidemia.    Review of systems  Constitutional: + Minimally fluctuating body weight,  current Body mass index is 33.91 kg/m. , no fatigue, no subjective hyperthermia, no subjective hypothermia Eyes: no blurry vision, no xerophthalmia ENT: no sore throat, no nodules palpated in throat, no dysphagia/odynophagia, no hoarseness Cardiovascular: no chest pain, no shortness of breath, no palpitations, no leg swelling Respiratory: no cough, no shortness of breath Gastrointestinal: no nausea/vomiting/diarrhea Musculoskeletal: bilateral knee pain- gets steroid and/or gel shots in knees every 3 months Skin: no rashes, no hyperemia Neurological: no tremors, no numbness, no tingling, no dizziness Psychiatric: no depression, no anxiety  Objective:    BP (!) 144/59 (BP Location: Right Arm)   Pulse 72   Ht 6' (1.829 m)    Wt 250 lb (113.4 kg)   BMI 33.91 kg/m   Wt Readings from Last 3 Encounters:  07/19/20 250 lb (113.4 kg)  01/19/20 247 lb 6.4 oz (112.2 kg)  07/19/19 247 lb (112 kg)    BP Readings from Last 3 Encounters:  07/19/20 (!) 144/59  01/19/20 (!) 163/65  07/19/19 (!) 149/71     Physical Exam- Limited  Constitutional:  Body mass index is 33.91 kg/m. , not in acute distress, normal state of mind Eyes:  EOMI, no exophthalmos Neck: Supple Cardiovascular: RRR, no murmers, rubs, or gallops, no edema Respiratory: Adequate breathing efforts, no crackles, rales, rhonchi, or wheezing Musculoskeletal: no gross deformities, strength intact in all four extremities, no gross restriction of joint movements Skin:  no rashes, no hyperemia Neurological: no tremor with outstretched hands   POCT ABI Results 07/19/20   Right ABI:  0.90      Left ABI:  PAD  Right leg systolic / diastolic: 433/29 mmHg Left leg systolic / diastolic: PAD mm Hg  Arm systolic / diastolic: 518/84 mmHG  Detailed report will be scanned into patient chart.    Recent Results (from the past 2160 hour(s))  TSH     Status: None   Collection Time: 07/10/20  8:38 AM  Result Value Ref Range   TSH 1.140 0.450 - 4.500 uIU/mL  T4, free     Status: None   Collection Time: 07/10/20  8:38 AM  Result Value Ref Range   Free T4 1.65 0.82 - 1.77 ng/dL  HgB A1c     Status: Abnormal   Collection Time: 07/19/20 11:33 AM  Result Value Ref Range   Hemoglobin A1C     HbA1c POC (<> result, manual entry)     HbA1c, POC (prediabetic range)     HbA1c, POC (controlled diabetic range) 6.7 0.0 - 7.0 %     Diabetic Labs (most recent): Lab Results  Component Value Date   HGBA1C 6.7 07/19/2020   HGBA1C 6.4 (A) 01/19/2020   HGBA1C 5.8 (A) 07/19/2019    Lipid Panel     Component Value Date/Time   CHOL 131 01/17/2020 0815   TRIG 328 (H) 01/17/2020 0815   HDL 27 (L) 01/17/2020 0815   CHOLHDL 4.9 01/17/2020 0815   LDLCALC 64  01/17/2020 0815     Assessment & Plan:   1) Diabetes mellitus with ESRD ([place])  His diabetes is  complicated by coronary artery disease status post CABG, recent stent placement, ESRD on dialysis, peripheral arterial disease, peripheral neuropathy.  He presents today with no meter or logs to review.  He does not monitor glucose routinely due to  Victoza only.  He does note that he gets injections in bilateral knees every 3 months or so which makes his sugar higher.  He had some left over Basaglar at home and would take 10 units when his glucose was over 300 for about 1 week until his glucose decreased back to normal.  He denies any episodes of hypoglycemia.  - Recent labs reviewed, patient now on hemodialysis for ESRD.  - Patient remains at a high risk for more acute and chronic complications of diabetes which include CAD, CVA, CKD, retinopathy, and neuropathy. These are all discussed in detail with the patient.  - Nutritional counseling repeated at each appointment due to patients tendency to fall back in to old habits.  - The patient admits there is a room for improvement in their diet and drink choices. -  Suggestion is made for the patient to avoid simple carbohydrates from their diet including Cakes, Sweet Desserts / Pastries, Ice Cream, Soda (diet and regular), Sweet Tea, Candies, Chips, Cookies, Sweet Pastries,  Store Bought Juices, Alcohol in Excess of  1-2 drinks a day, Artificial Sweeteners, Coffee Creamer, and "Sugar-free" Products. This will help patient to have stable blood glucose profile and potentially avoid unintended weight gain.   - I encouraged the patient to switch to  unprocessed or minimally processed complex starch and increased protein intake (animal or plant source), fruits, and vegetables.   - Patient is advised to stick to a routine mealtimes to eat 3 meals  a day and avoid unnecessary snacks ( to snack only to correct hypoglycemia).  - I have approached patient with  the following individualized plan to manage diabetes and patient agrees.  -From diabetes point of view, he continues to do well with less medications.  He is advised to continue Victoza 1.2 mg subcutaneously daily.  I supplied him with sample Tresiba pen to use 10 units nightly after he gets his steroid shots to counteract the resulting hyperglycemia.  -Patient is not a candidate for metformin and SGLT2 inhibitors due to ESRD.  -He is advised to maintain close follow up with his nephrologist.  - Patient specific target  for A1c; LDL, HDL, Triglycerides, and  Waist Circumference were discussed in detail.  2) BP/HTN:  His blood pressure is controlled to target.  He is advised to continue Norvasc 10 mg po daily, Coreg 6.25 mg po twice daily, Lasix 40 mg po twice daily, and Metoprolol 100 mg po twice daily.  Will defer any med changes to nephrologist.  3) Lipids/HPL:  His most recent lipid panel from 01/17/20 shows improved LDL of 64 and worsening triglycerides of 328.  He is advised to continue Crestor 20 mg po daily before bed.  Side effects and precautions discussed with him.  He is advised to start OTC Fish Oil supplement for help with triglycerides as well as avoiding fried foods and butter.  4) Primary hypothyroidism -His previsit TFTs are consistent with appropriate hormone replacement.  He is advised to continue Levothyroxine 200 mcg po daily before breakfast.   - We discussed about the correct intake of his thyroid hormone, on empty stomach at fasting, with water, separated by at least 30 minutes from breakfast and other medications,  and separated by more than 4 hours from calcium, iron, multivitamins, acid reflux medications (PPIs). -Patient is made aware of the fact that thyroid hormone replacement is needed for life, dose to be adjusted by periodic monitoring of thyroid function tests.   5) Vitamin D deficiency -His  most recent vitamin D level on 01/17/20 was 71, improving from  previous visit.  He is advised to continue OTC Vitamin D 3 5000 units daily as maintenance dose.   I advised patient to maintain close follow up with his PCP for primary care needs.  -As far as his abnormal ABI goes, he sees his cardiologist for his vascular issues as he already has had stenting in his LLE veins for circulation issues.  He is advised to take copy of ABI to his next appointment with his cardiologist.   I spent 30 minutes in the care of the patient today including review of labs from [facility], Lipids, Thyroid Function, Hematology (current and previous including abstractions from other facilities); face-to-face time discussing  his blood glucose readings/logs, discussing hypoglycemia and hyperglycemia episodes and symptoms, medications doses, his options of short and long term treatment based on the latest standards of care / guidelines;  discussion about incorporating lifestyle medicine;  and documenting the encounter.    Please refer to Patient Instructions for Blood Glucose Monitoring and Insulin/Medications Dosing Guide"  in media tab for additional information. Please  also refer to " Patient Self Inventory" in the Media  tab for reviewed elements of pertinent patient history.  Cory Steele participated in the discussions, expressed understanding, and voiced agreement with the above plans.  All questions were answered to his satisfaction. he is encouraged to contact clinic should he have any questions or concerns prior to his return visit.  Follow up plan: Return in about 6 months (around 01/18/2021) for Diabetes F/U with A1c in office, No previsit labs.  Rayetta Pigg, [facility] [address] [place], [place] [address] Phone: [phone] Fax: [phone]  07/19/2020, 11:48 AM

## 2020-07-19 NOTE — Patient Instructions (Signed)

## 2020-10-18 ENCOUNTER — Other Ambulatory Visit: Payer: Self-pay | Admitting: Nurse Practitioner

## 2020-10-22 ENCOUNTER — Other Ambulatory Visit: Payer: Self-pay | Admitting: Nurse Practitioner

## 2021-01-06 ENCOUNTER — Other Ambulatory Visit: Payer: Self-pay | Admitting: Nurse Practitioner

## 2021-01-20 ENCOUNTER — Other Ambulatory Visit: Payer: Self-pay | Admitting: Nurse Practitioner

## 2021-01-23 NOTE — Patient Instructions (Signed)
Advice for Weight Management  -For most of us the best way to lose weight is by diet management. Generally speaking, diet management means consuming less calories intentionally which over time brings about progressive weight loss.  This can be achieved more effectively by restricting carbohydrate consumption to the minimum possible.  So, it is critically important to know your numbers: how much calorie you are consuming and how much calorie you need. More importantly, our carbohydrates sources should be unprocessed or minimally processed complex starch food items.   Sometimes, it is important to balance nutrition by increasing protein intake (animal or plant source), fruits, and vegetables.  -Sticking to a routine mealtime to eat 3 meals a day and avoiding unnecessary snacks is shown to have a big role in weight control. Under normal circumstances, the only time we lose real weight is when we are hungry, so allow hunger to take place- hunger means no food between meal times, only water.  It is not advisable to starve.   -It is better to avoid simple carbohydrates including: Cakes, Sweet Desserts, Ice Cream, Soda (diet and regular), Sweet Tea, Candies, Chips, Cookies, Store Bought Juices, Alcohol in Excess of  1-2 drinks a day, Artificial Sweeteners, Doughnuts, Coffee Creamers, "Sugar-free" Products, etc, etc.  This is not a complete list.....    -Consulting with certified diabetes educators is proven to provide you with the most accurate and current information on diet.  Also, you may be  interested in discussing diet options/exchanges , we can schedule a visit with Cory Steele, RDN, CDE for individualized nutrition education.  -Exercise: If you are able: 30 -60 minutes a day ,4 days a week, or 150 minutes a week.  The longer the better.  Combine stretch, strength, and aerobic activities.  If you were told in the past that you have high risk for cardiovascular diseases, you may seek evaluation by  your heart doctor prior to initiating moderate to intense exercise programs.    

## 2021-01-24 ENCOUNTER — Ambulatory Visit (INDEPENDENT_AMBULATORY_CARE_PROVIDER_SITE_OTHER): Payer: Medicare Other | Admitting: Nurse Practitioner

## 2021-01-24 ENCOUNTER — Encounter: Payer: Self-pay | Admitting: Nurse Practitioner

## 2021-01-24 VITALS — BP 139/67 | HR 76 | Ht 72.0 in | Wt 249.0 lb

## 2021-01-24 DIAGNOSIS — E782 Mixed hyperlipidemia: Secondary | ICD-10-CM | POA: Diagnosis not present

## 2021-01-24 DIAGNOSIS — N185 Chronic kidney disease, stage 5: Secondary | ICD-10-CM | POA: Diagnosis not present

## 2021-01-24 DIAGNOSIS — E039 Hypothyroidism, unspecified: Secondary | ICD-10-CM

## 2021-01-24 DIAGNOSIS — E1122 Type 2 diabetes mellitus with diabetic chronic kidney disease: Secondary | ICD-10-CM | POA: Diagnosis not present

## 2021-01-24 LAB — POCT GLYCOSYLATED HEMOGLOBIN (HGB A1C): Hemoglobin A1C: 6.3 % — AB (ref 4.0–5.6)

## 2021-01-24 NOTE — Progress Notes (Signed)
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    Past Surgical History:  Procedure Laterality Date   CORONARY ARTERY BYPASS GRAFT     Social History   Socioeconomic History   Marital status: Married    Spouse name: Not on file   Number of children: Not on file   Years of education: Not on file   Highest education level: Not on file  Occupational History   Not on file  Tobacco Use   Smoking status: Every Day   Smokeless tobacco: Never  Vaping Use   Vaping Use: Never used  Substance and Sexual Activity   Alcohol use: No    Alcohol/week: 0.0 standard drinks   Drug use: No   Sexual activity: Not on file  Other Topics Concern   Not on file  Social History Narrative   Not on file   Social Determinants of Health   Financial Resource Strain: Not on file  Food Insecurity: Not on file  Transportation Needs: Not on file  Physical Activity: Not on file  Stress: Not on file  Social Connections: Not on file   Outpatient Encounter Medications as of 01/24/2021  Medication Sig   albuterol (PROVENTIL) (2.5 MG/3ML) 0.083% nebulizer solution Inhale 3 mLs into the lungs every 6 (six) hours as needed.   amLODipine (NORVASC) 10 MG tablet Take 10 mg by mouth daily.   ascorbic acid (VITAMIN C) 1000 MG tablet Take by mouth.   aspirin 81 MG tablet Take by mouth daily.    budesonide-formoterol (SYMBICORT) 160-4.5 MCG/ACT inhaler Inhale into the lungs.   carvedilol (COREG) 6.25 MG tablet Take 6.25 mg by mouth 2 (two) times daily.   clopidogrel (PLAVIX) 75 MG tablet Take 75 mg by mouth daily.   D-5000 125 MCG (5000 UT) TABS TAKE 1 TABLET  BY MOUTH EVERY DAY   ferric citrate (AURYXIA) 1 GM 210 MG(Fe) tablet Take 420 mg by mouth 3 (three) times daily with meals.   fluocinonide (LIDEX) 0.05 % external solution Apply topically.   furosemide (LASIX) 40 MG tablet Take 40 mg by mouth 2 (two) times daily.   glipiZIDE (GLUCOTROL) 10 MG tablet Take by mouth.   Insulin Pen Needle (B-D ULTRAFINE III SHORT PEN) 31G X 8 MM MISC USE AS DIRECTED   lanthanum (FOSRENOL) 1000 MG chewable tablet Chew 1,000 mg by mouth 3 (three) times daily.   levothyroxine (SYNTHROID) 200 MCG tablet TAKE 1 TABLET BY MOUTH EVERY DAY BEFORE BREAKFAST   liraglutide (VICTOZA) 18 MG/3ML SOPN Inject 1.2 mg into the skin daily. INJECT 1.8 MG UNDER SKIN ONCE DAILY   magnesium oxide (MAG-OX) 400 MG tablet Take 1 tablet by mouth daily.   metolazone (ZAROXOLYN) 2.5 MG tablet Take by mouth.   metoprolol tartrate (LOPRESSOR) 100 MG tablet Take 100 mg by mouth 2 (two) times daily.   omeprazole (PRILOSEC) 20 MG capsule Take 20 mg by mouth daily.   pregabalin (LYRICA) 75 MG capsule Take 1 capsule (75 mg total) by mouth 2 (two)  times daily.   rosuvastatin (CRESTOR) 20 MG tablet Take 20 mg by mouth daily.   SKYRIZI PEN 150 MG/ML SOAJ    sodium bicarbonate 650 MG tablet Take 1,300 mg by mouth 2 (two) times daily.    SPIRIVA RESPIMAT 2.5 MCG/ACT AERS SMARTSIG:2 Puff(s) Via Inhaler Daily   trazodone (DESYREL) 300 MG tablet Take 300 mg by mouth at bedtime.   TRUE METRIX BLOOD GLUCOSE TEST test strip USE TO TEST BLOOD SUGAR 4 TIMES A DAY   TRUEPLUS LANCETS 28G MISC USE AS DIRECTED 2 TIMES A DAY   [DISCONTINUED] betamethasone dipropionate (DIPROLENE) 0.05 % ointment Apply 1 application topically 2 (two) times daily.   [DISCONTINUED] BROMSITE 0.075 % SOLN Apply 1 drop to eye 2 (two) times daily.   [DISCONTINUED] calcitRIOL (ROCALTROL) 0.25 MCG capsule Take 0.25 mcg by mouth daily.   [DISCONTINUED] Vitamin D, Ergocalciferol, (DRISDOL) 50000 units CAPS capsule TAKE 1 CAPSULE BY MOUTH TWICE  A WEEK   No facility-administered encounter medications on file as of 01/24/2021.   ALLERGIES: Allergies  Allergen Reactions   Codeine    Vancomycin    VACCINATION STATUS:  There is no immunization history on file for this patient.  Diabetes He presents for his follow-up diabetic visit. He has type 2 diabetes mellitus. Onset time: He was diagnosed at approximate age of 23 years. His disease course has been stable. There are no hypoglycemic associated symptoms. Pertinent negatives for hypoglycemia include no confusion, nervousness/anxiousness, pallor, seizures or tremors. Pertinent negatives for diabetes include no fatigue, no polydipsia, no polyphagia, no polyuria, no weakness and no weight loss. There are no hypoglycemic complications. Symptoms are stable. Diabetic complications include heart disease, nephropathy, peripheral neuropathy and PVD. (Now on hemodialysis.) Risk factors for coronary artery disease include diabetes mellitus, dyslipidemia, male sex, obesity, tobacco exposure, hypertension and sedentary lifestyle. Current diabetic treatments: He is on Victoza 1.2 mg subcutaneously daily. He is compliant with treatment all of the time. His weight is fluctuating minimally. He is following a generally unhealthy diet. When asked about meal planning, he reported none. He has not had a previous visit with a dietitian. He rarely participates in exercise. His home blood glucose trend is fluctuating minimally. (He presents today with no meter or logs, does not routinely monitor due to monotherapy with Victoza only.  His POCT A1c today is 6.3%, stable.  He denies any s/s of hypoglycemia.  He is still adjusting to his hemodialysis- needs revision of his graft due to pain.  He still gets steroid shots in his knees every 3 months or so.  He will use some of his left-over insulin during those times until his glucose levels back out.) An ACE inhibitor/angiotensin II receptor blocker is not being taken. He  does not see a podiatrist.Eye exam is current.  Hyperlipidemia This is a chronic problem. The current episode started more than 1 year ago. The problem is uncontrolled. Recent lipid tests were reviewed and are variable. Exacerbating diseases include chronic renal disease, diabetes, hypothyroidism and obesity. Factors aggravating his hyperlipidemia include beta blockers and fatty foods. Pertinent negatives include no myalgias. Current antihyperlipidemic treatment includes statins. The current treatment provides moderate improvement of lipids. Compliance problems include adherence to diet and adherence to exercise.  Risk factors for coronary artery disease include dyslipidemia, diabetes mellitus, hypertension, male sex, obesity and a sedentary lifestyle.  Thyroid Problem Presents for follow-up (He is on levothyroxine 200 mcg p.o. every morning. ) visit. Patient reports no anxiety, constipation, diarrhea, fatigue, palpitations, tremors, weight  gain or weight loss. The symptoms have been stable. Past treatments include levothyroxine. His past medical history is significant for diabetes and hyperlipidemia.   Review of systems  Constitutional: + Minimally fluctuating body weight,  current Body mass index is 33.77 kg/m. , no fatigue, no subjective hyperthermia, no subjective hypothermia Eyes: no blurry vision, no xerophthalmia ENT: no sore throat, no nodules palpated in throat, no dysphagia/odynophagia, no hoarseness Cardiovascular: no chest pain, no shortness of breath, no palpitations, no leg swelling, HD graft left arm Respiratory: no cough, no shortness of breath Gastrointestinal: no nausea/vomiting/diarrhea Musculoskeletal: bilateral knee pain- gets steroid and/or gel shots in knees every 3 months Skin: no rashes, no hyperemia Neurological: no tremors, no numbness, no tingling, no dizziness Psychiatric: no depression, no anxiety  Objective:    BP 139/67   Pulse 76   Ht 6' (1.829 m)   Wt 249 lb  (112.9 kg)   BMI 33.77 kg/m   Wt Readings from Last 3 Encounters:  01/24/21 249 lb (112.9 kg)  07/19/20 250 lb (113.4 kg)  01/19/20 247 lb 6.4 oz (112.2 kg)    BP Readings from Last 3 Encounters:  01/24/21 139/67  07/19/20 (!) 144/59  01/19/20 (!) 163/65     Physical Exam- Limited  Constitutional:  Body mass index is 33.77 kg/m. , not in acute distress, normal state of mind Eyes:  EOMI, no exophthalmos Neck: Supple Cardiovascular: RRR, no murmurs, rubs, or gallops, no edema Respiratory: Adequate breathing efforts, no crackles, rales, rhonchi, or wheezing Musculoskeletal: no gross deformities, strength intact in all four extremities, no gross restriction of joint movements Skin:  no rashes, no hyperemia Neurological: no tremor with outstretched hands    No results found for this or any previous visit (from the past 2160 hour(s)).    Diabetic Labs (most recent): Lab Results  Component Value Date   HGBA1C 6.7 07/19/2020   HGBA1C 6.4 (A) 01/19/2020   HGBA1C 5.8 (A) 07/19/2019    Lipid Panel     Component Value Date/Time   CHOL 131 01/17/2020 0815   TRIG 328 (H) 01/17/2020 0815   HDL 27 (L) 01/17/2020 0815   CHOLHDL 4.9 01/17/2020 0815   LDLCALC 64 01/17/2020 0815     Assessment & Plan:   1) Diabetes mellitus with ESRD ([place])  His diabetes is  complicated by coronary artery disease status post CABG, recent stent placement, ESRD on dialysis, peripheral arterial disease, peripheral neuropathy.  He presents today with no meter or logs, does not routinely monitor due to monotherapy with Victoza only.  His POCT A1c today is 6.3%, stable.  He denies any s/s of hypoglycemia.  He is still adjusting to his hemodialysis- needs revision of his graft due to pain.  He still gets steroid shots in his knees every 3 months or so.  He will use some of his left-over insulin during those times until his glucose levels back out.  - Recent labs reviewed, patient now on hemodialysis  for ESRD.  - Patient remains at a high risk for more acute and chronic complications of diabetes which include CAD, CVA, CKD, retinopathy, and neuropathy. These are all discussed in detail with the patient.  - Nutritional counseling repeated at each appointment due to patients tendency to fall back in to old habits.  - The patient admits there is a room for improvement in their diet and drink choices. -  Suggestion is made for the patient to avoid simple carbohydrates from their diet including Cakes, Sweet Desserts /  Pastries, Ice Cream, Soda (diet and regular), Sweet Tea, Candies, Chips, Cookies, Sweet Pastries, Store Bought Juices, Alcohol in Excess of 1-2 drinks a day, Artificial Sweeteners, Coffee Creamer, and "Sugar-free" Products. This will help patient to have stable blood glucose profile and potentially avoid unintended weight gain.   - I encouraged the patient to switch to unprocessed or minimally processed complex starch and increased protein intake (animal or plant source), fruits, and vegetables.   - Patient is advised to stick to a routine mealtimes to eat 3 meals a day and avoid unnecessary snacks (to snack only to correct hypoglycemia).  - I have approached patient with the following individualized plan to manage diabetes and patient agrees.  -From diabetes point of view, he continues to do well with less medications.  He is advised to continue Victoza 1.2 mg subcutaneously daily.    -He does not need to routinely monitor glucose due to monotherapy with Victoza only, is advised to check his glucose after he gets steroid shots since he tends to increase significantly.  -Patient is not a candidate for metformin and SGLT2 inhibitors due to ESRD.  -He is advised to maintain close follow up with his nephrologist.  - Patient specific target  for A1c; LDL, HDL, Triglycerides, and  Waist Circumference were discussed in detail.  2) BP/HTN:  His blood pressure is controlled to target.   He is advised to continue Norvasc 10 mg po daily, Coreg 6.25 mg po twice daily, Lasix 40 mg po twice daily, and Metoprolol 100 mg po twice daily.  Will defer any med changes to nephrologist.  3) Lipids/HPL:  His most recent lipid panel from 01/17/20 shows improved LDL of 64 and worsening triglycerides of 328.  He is advised to continue Crestor 20 mg po daily before bed.  Side effects and precautions discussed with him.  He is advised to start OTC Fish Oil supplement for help with triglycerides as well as avoiding fried foods and butter.  His cardiologist recently checked lipid panel, will request copy.  4) Primary hypothyroidism -He does not have recent TFTs to review. He is advised to continue Levothyroxine 200 mcg po daily before breakfast.  Will recheck TFTs prior to next visit and adjust dose if necessary.   - We discussed about the correct intake of his thyroid hormone, on empty stomach at fasting, with water, separated by at least 30 minutes from breakfast and other medications,  and separated by more than 4 hours from calcium, iron, multivitamins, acid reflux medications (PPIs). -Patient is made aware of the fact that thyroid hormone replacement is needed for life, dose to be adjusted by periodic monitoring of thyroid function tests.   5) Vitamin D deficiency -His most recent vitamin D level on 01/17/20 was 71, improving from previous visit.  He is advised to continue OTC Vitamin D 3 5000 units daily as maintenance dose.   I advised patient to maintain close follow up with his PCP for primary care needs.    I spent 30 minutes in the care of the patient today including review of labs from [place], Lipids, Thyroid Function, Hematology (current and previous including abstractions from other facilities); face-to-face time discussing  his blood glucose readings/logs, discussing hypoglycemia and hyperglycemia episodes and symptoms, medications doses, his options of short and long term treatment based  on the latest standards of care / guidelines;  discussion about incorporating lifestyle medicine;  and documenting the encounter.    Please refer to Patient Instructions for Blood Glucose  Monitoring and Insulin/Medications Dosing Guide"  in media tab for additional information. Please  also refer to " Patient Self Inventory" in the Media  tab for reviewed elements of pertinent patient history.  Cory Steele participated in the discussions, expressed understanding, and voiced agreement with the above plans.  All questions were answered to his satisfaction. he is encouraged to contact clinic should he have any questions or concerns prior to his return visit.  Follow up plan: Return in about 6 months (around 07/24/2021) for Diabetes F/U with A1c in office, Thyroid follow up, Previsit labs.  Rayetta Pigg, [facility] [address] [place], [place] [address] Phone: [phone] Fax: [phone]  01/24/2021, 11:38 AM

## 2021-02-21 ENCOUNTER — Other Ambulatory Visit: Payer: Self-pay | Admitting: Nurse Practitioner

## 2021-02-21 DIAGNOSIS — E1122 Type 2 diabetes mellitus with diabetic chronic kidney disease: Secondary | ICD-10-CM

## 2021-03-26 ENCOUNTER — Other Ambulatory Visit: Payer: Self-pay | Admitting: Nurse Practitioner

## 2021-04-22 ENCOUNTER — Other Ambulatory Visit: Payer: Self-pay | Admitting: Nurse Practitioner

## 2021-06-10 ENCOUNTER — Other Ambulatory Visit: Payer: Self-pay | Admitting: Nurse Practitioner

## 2021-07-19 LAB — T4, FREE: Free T4: 1.67 ng/dL (ref 0.82–1.77)

## 2021-07-19 LAB — TSH: TSH: 1.2 u[IU]/mL (ref 0.450–4.500)

## 2021-07-25 ENCOUNTER — Ambulatory Visit (INDEPENDENT_AMBULATORY_CARE_PROVIDER_SITE_OTHER): Payer: Medicare Other | Admitting: Nurse Practitioner

## 2021-07-25 ENCOUNTER — Encounter: Payer: Self-pay | Admitting: Nurse Practitioner

## 2021-07-25 VITALS — BP 135/69 | HR 72 | Ht 72.0 in | Wt 250.0 lb

## 2021-07-25 DIAGNOSIS — N185 Chronic kidney disease, stage 5: Secondary | ICD-10-CM

## 2021-07-25 DIAGNOSIS — E782 Mixed hyperlipidemia: Secondary | ICD-10-CM

## 2021-07-25 DIAGNOSIS — E1122 Type 2 diabetes mellitus with diabetic chronic kidney disease: Secondary | ICD-10-CM

## 2021-07-25 DIAGNOSIS — E039 Hypothyroidism, unspecified: Secondary | ICD-10-CM | POA: Diagnosis not present

## 2021-07-25 LAB — POCT GLYCOSYLATED HEMOGLOBIN (HGB A1C): HbA1c, POC (controlled diabetic range): 5.4 % (ref 0.0–7.0)

## 2021-07-25 MED ORDER — VICTOZA 18 MG/3ML ~~LOC~~ SOPN: 1.2000 mg | PEN_INJECTOR | Freq: Every day | SUBCUTANEOUS | 3 refills | Status: DC

## 2021-07-25 MED ORDER — LEVOTHYROXINE SODIUM 200 MCG PO TABS: 200.0000 ug | ORAL_TABLET | Freq: Every day | ORAL | 3 refills | Status: DC

## 2021-07-25 NOTE — Patient Instructions (Signed)
Diabetes Mellitus and Foot Care Foot care is an important part of your health, especially when you have diabetes. Diabetes may cause you to have problems because of poor blood flow (circulation) to your feet and legs, which can cause your skin to: Become thinner and drier. Break more easily. Heal more slowly. Peel and crack. You may also have nerve damage (neuropathy) in your legs and feet, causing decreased feeling in them. This means that you may not notice minor injuries to your feet that could lead to more serious problems. Noticing and addressing any potential problems early is the best way to prevent future foot problems. How to care for your feet Foot hygiene  Wash your feet daily with warm water and mild soap. Do not use hot water. Then, pat your feet and the areas between your toes until they are completely dry. Do not soak your feet as this can dry your skin. Trim your toenails straight across. Do not dig under them or around the cuticle. File the edges of your nails with an emery board or nail file. Apply a moisturizing lotion or petroleum jelly to the skin on your feet and to dry, brittle toenails. Use lotion that does not contain alcohol and is unscented. Do not apply lotion between your toes. Shoes and socks Wear clean socks or stockings every day. Make sure they are not too tight. Do not wear knee-high stockings since they may decrease blood flow to your legs. Wear shoes that fit properly and have enough cushioning. Always look in your shoes before you put them on to be sure there are no objects inside. To break in new shoes, wear them for just a few hours a day. This prevents injuries on your feet. Wounds, scrapes, corns, and calluses  Check your feet daily for blisters, cuts, bruises, sores, and redness. If you cannot see the bottom of your feet, use a mirror or ask someone for help. Do not cut corns or calluses or try to remove them with medicine. If you find a minor scrape,  cut, or break in the skin on your feet, keep it and the skin around it clean and dry. You may clean these areas with mild soap and water. Do not clean the area with peroxide, alcohol, or iodine. If you have a wound, scrape, corn, or callus on your foot, look at it several times a day to make sure it is healing and not infected. Check for: Redness, swelling, or pain. Fluid or blood. Warmth. Pus or a bad smell. General tips Do not cross your legs. This may decrease blood flow to your feet. Do not use heating pads or hot water bottles on your feet. They may burn your skin. If you have lost feeling in your feet or legs, you may not know this is happening until it is too late. Protect your feet from hot and cold by wearing shoes, such as at the beach or on hot pavement. Schedule a complete foot exam at least once a year (annually) or more often if you have foot problems. Report any cuts, sores, or bruises to your health care provider immediately. Where to find more information American Diabetes Association: www.diabetes.org Association of Diabetes Care & Education Specialists: www.diabeteseducator.org Contact a health care provider if: You have a medical condition that increases your risk of infection and you have any cuts, sores, or bruises on your feet. You have an injury that is not healing. You have redness on your legs or feet. You   feel burning or tingling in your legs or feet. You have pain or cramps in your legs and feet. Your legs or feet are numb. Your feet always feel cold. You have pain around any toenails. Get help right away if: You have a wound, scrape, corn, or callus on your foot and: You have pain, swelling, or redness that gets worse. You have fluid or blood coming from the wound, scrape, corn, or callus. Your wound, scrape, corn, or callus feels warm to the touch. You have pus or a bad smell coming from the wound, scrape, corn, or callus. You have a fever. You have a red  line going up your leg. Summary Check your feet every day for blisters, cuts, bruises, sores, and redness. Apply a moisturizing lotion or petroleum jelly to the skin on your feet and to dry, brittle toenails. Wear shoes that fit properly and have enough cushioning. If you have foot problems, report any cuts, sores, or bruises to your health care provider immediately. Schedule a complete foot exam at least once a year (annually) or more often if you have foot problems. This information is not intended to replace advice given to you by your health care provider. Make sure you discuss any questions you have with your health care provider. Document Revised: 09/29/2019 Document Reviewed: 09/29/2019 Elsevier Patient Education  2023 [employer]  

## 2021-07-25 NOTE — Progress Notes (Signed)
? ?07/25/2021 ? ?            ?Endocrinology follow-up note ? ? ?Subjective:  ? ? Patient ID: Cory Steele, male    DOB: [birth year],  ? ? ?Past Medical History:  ?Diagnosis Date  ? CAD (coronary artery disease)   ? Diabetes mellitus, type II ([place])   ? Hyperlipidemia   ? Hypertension   ? Hypothyroidism   ? Kidney disease   ? Obesity   ? 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 ?Past Surgical History:  ?Procedure Laterality Date  ? CORONARY ARTERY BYPASS GRAFT    ? ?Social History  ? ?Socioeconomic History  ? Marital status: Married  ?  Spouse name: Not on file  ? Number of children: Not on file  ? Years of education: Not on file  ? Highest education level: Not on file  ?Occupational History  ? Not on file  ?Tobacco Use  ? Smoking status: Every Day  ? Smokeless tobacco: Never  ?Vaping Use  ? Vaping Use: Never used  ?Substance and Sexual Activity  ? Alcohol use: No  ?  Alcohol/week: 0.0 standard drinks  ? Drug use: No  ? Sexual activity: Not on file  ?Other Topics Concern  ? Not on file  ?Social History Narrative  ? Not on file  ? ?Social Determinants of Health  ? ?Financial Resource Strain: Not on file  ?Food Insecurity: Not on file  ?Transportation Needs: Not on file  ?Physical Activity: Not on file  ?Stress: Not on file  ?Social Connections: Not on file  ? ?Outpatient Encounter Medications as of 07/25/2021  ?Medication Sig  ? albuterol (PROVENTIL) (2.5 MG/3ML) 0.083% nebulizer solution Inhale 3 mLs into the lungs every 6 (six) hours as needed.  ? amLODipine (NORVASC) 10 MG tablet Take 10 mg by mouth daily.  ? ascorbic acid (VITAMIN C) 1000 MG tablet Take by mouth.  ? aspirin 81 MG tablet Take by mouth daily.   ? budesonide-formoterol (SYMBICORT) 160-4.5 MCG/ACT inhaler Inhale into the lungs.  ? carvedilol (COREG) 6.25 MG tablet Take 6.25 mg by mouth 2 (two) times daily.  ? clopidogrel (PLAVIX) 75 MG tablet Take 75 mg by mouth daily.  ? ferric citrate (AURYXIA) 1 GM 210 MG(Fe) tablet  Take 420 mg by mouth 3 (three) times daily with meals.  ? fluocinonide (LIDEX) 0.05 % external solution Apply topically.  ? furosemide (LASIX) 40 MG tablet Take 40 mg by mouth 2 (two) times daily.  ? glipiZIDE (GLUCOTROL) 10 MG tablet Take by mouth.  ? Insulin Pen Needle (B-D ULTRAFINE III SHORT PEN) 31G X 8 MM MISC USE AS DIRECTED  ? lanthanum (FOSRENOL) 1000 MG chewable tablet Chew 1,000 mg by mouth 3 (three) times daily.  ? levothyroxine (SYNTHROID) 200 MCG tablet Take 1 tablet (200 mcg total) by mouth daily before breakfast.  ? liraglutide (VICTOZA) 18 MG/3ML SOPN Inject 1.2 mg into the skin daily.  ? magnesium oxide (MAG-OX) 400 MG tablet Take 1 tablet by mouth daily.  ? metolazone (ZAROXOLYN) 2.5 MG tablet Take by mouth.  ? metoprolol tartrate (LOPRESSOR) 100 MG tablet Take 100 mg by mouth 2 (two) times daily.  ? NATURAL VITAMIN D-3 125 MCG (5000 UT) TABS TAKE 1 TABLET BY MOUTH EVERY DAY  ? omeprazole (PRILOSEC) 20 MG capsule Take 20 mg by mouth daily.  ? pregabalin (LYRICA) 75 MG capsule Take 1 capsule (75 mg total) by mouth 2 (two) times daily.  ?  rosuvastatin (CRESTOR) 20 MG tablet Take 20 mg by mouth daily.  ? SKYRIZI PEN 150 MG/ML SOAJ   ? sodium bicarbonate 650 MG tablet Take 1,300 mg by mouth 2 (two) times daily.   ? SPIRIVA RESPIMAT 2.5 MCG/ACT AERS SMARTSIG:2 Puff(s) Via Inhaler Daily  ? trazodone (DESYREL) 300 MG tablet Take 300 mg by mouth at bedtime.  ? TRUE METRIX BLOOD GLUCOSE TEST test strip USE TO TEST BLOOD SUGAR 4 TIMES A DAY  ? TRUEPLUS LANCETS 28G MISC USE AS DIRECTED 2 TIMES A DAY  ? [DISCONTINUED] levothyroxine (SYNTHROID) 200 MCG tablet TAKE 1 TABLET BY MOUTH EVERY DAY BEFORE BREAKFAST  ? [DISCONTINUED] liraglutide (VICTOZA) 18 MG/3ML SOPN Inject 1.2 mg into the skin daily.  ? ?No facility-administered encounter medications on file as of 07/25/2021.  ? ?ALLERGIES: ?Allergies  ?Allergen Reactions  ? Codeine   ? Vancomycin   ? ?VACCINATION STATUS: ? ?There is no immunization history on file  for this patient. ? ?Diabetes ?He presents for his follow-up diabetic visit. He has type 2 diabetes mellitus. Onset time: He was diagnosed at approximate age of 58 years. His disease course has been stable. There are no hypoglycemic associated symptoms. Pertinent negatives for hypoglycemia include no confusion, nervousness/anxiousness, pallor, seizures or tremors. Pertinent negatives for diabetes include no fatigue, no polydipsia, no polyphagia, no polyuria, no weakness and no weight loss. There are no hypoglycemic complications. Symptoms are stable. Diabetic complications include heart disease, nephropathy, peripheral neuropathy and PVD. (Now on hemodialysis.) Risk factors for coronary artery disease include diabetes mellitus, dyslipidemia, male sex, obesity, tobacco exposure, hypertension and sedentary lifestyle. Current diabetic treatments: He is on Victoza 1.2 mg subcutaneously daily. He is compliant with treatment all of the time. His weight is fluctuating minimally. He is following a generally unhealthy diet. When asked about meal planning, he reported none. He has not had a previous visit with a dietitian. He rarely participates in exercise. (He presents today with no meter or logs, does not routinely monitor due to monotherapy with Victoza only.  His POCT A1c today is 5.4%, improving from last visit of 6.3%.  He denies any s/s of hypoglycemia.) An ACE inhibitor/angiotensin II receptor blocker is not being taken. He does not see a podiatrist.Eye exam is current.  ?Hyperlipidemia ?This is a chronic problem. The current episode started more than 1 year ago. The problem is uncontrolled. Recent lipid tests were reviewed and are variable. Exacerbating diseases include chronic renal disease, diabetes, hypothyroidism and obesity. Factors aggravating his hyperlipidemia include beta blockers and fatty foods. Pertinent negatives include no myalgias. Current antihyperlipidemic treatment includes statins. The current  treatment provides moderate improvement of lipids. Compliance problems include adherence to diet and adherence to exercise.  Risk factors for coronary artery disease include dyslipidemia, diabetes mellitus, hypertension, male sex, obesity and a sedentary lifestyle.  ?Thyroid Problem ?Presents for follow-up (He is on levothyroxine 200 mcg p.o. every morning. ) visit. Patient reports no anxiety, constipation, diarrhea, fatigue, palpitations, tremors, weight gain or weight loss. The symptoms have been stable. Past treatments include levothyroxine. His past medical history is significant for diabetes and hyperlipidemia.  ? ?Review of systems ? ?Constitutional: + Minimally fluctuating body weight,  current Body mass index is 33.91 kg/m?. , no fatigue, no subjective hyperthermia, no subjective hypothermia ?Eyes: no blurry vision, no xerophthalmia ?ENT: no sore throat, no nodules palpated in throat, no dysphagia/odynophagia, no hoarseness ?Cardiovascular: no chest pain, no shortness of breath, no palpitations, no leg swelling, HD graft left arm ?Respiratory:  no cough, no shortness of breath ?Gastrointestinal: no nausea/vomiting/diarrhea ?Musculoskeletal: bilateral knee pain- gets steroid and/or gel shots in knees every 3 months ?Skin: no rashes, no hyperemia ?Neurological: no tremors, no numbness, no tingling, no dizziness ?Psychiatric: no depression, no anxiety ? ?Objective:  ?  ?BP 135/69   Pulse 72   Ht 6' (1.829 m)   Wt 250 lb (113.4 kg)   BMI 33.91 kg/m?   ?Wt Readings from Last 3 Encounters:  ?07/25/21 250 lb (113.4 kg)  ?01/24/21 249 lb (112.9 kg)  ?07/19/20 250 lb (113.4 kg)  ?  ?BP Readings from Last 3 Encounters:  ?07/25/21 135/69  ?01/24/21 139/67  ?07/19/20 (!) 144/59  ? ? ? ?Physical Exam- Limited ? ?Constitutional:  Body mass index is 33.91 kg/m?. , not in acute distress, normal state of mind ?Eyes:  EOMI, no exophthalmos ?Neck: Supple ?Cardiovascular: RRR, no murmurs, rubs, or gallops, no  edema ?Respiratory: Adequate breathing efforts, no crackles, rales, rhonchi, or wheezing ?Musculoskeletal: no gross deformities, strength intact in all four extremities, no gross restriction of joint movements ?Skin:  no

## 2021-10-30 ENCOUNTER — Other Ambulatory Visit: Payer: Self-pay | Admitting: Nurse Practitioner

## 2022-01-28 ENCOUNTER — Encounter: Payer: Self-pay | Admitting: Nurse Practitioner

## 2022-01-28 ENCOUNTER — Ambulatory Visit (INDEPENDENT_AMBULATORY_CARE_PROVIDER_SITE_OTHER): Payer: Medicare Other | Admitting: Nurse Practitioner

## 2022-01-28 VITALS — BP 104/59 | HR 78 | Ht 72.0 in | Wt 236.4 lb

## 2022-01-28 DIAGNOSIS — N185 Chronic kidney disease, stage 5: Secondary | ICD-10-CM

## 2022-01-28 DIAGNOSIS — E782 Mixed hyperlipidemia: Secondary | ICD-10-CM | POA: Diagnosis not present

## 2022-01-28 DIAGNOSIS — E1122 Type 2 diabetes mellitus with diabetic chronic kidney disease: Secondary | ICD-10-CM | POA: Diagnosis not present

## 2022-01-28 DIAGNOSIS — E039 Hypothyroidism, unspecified: Secondary | ICD-10-CM | POA: Diagnosis not present

## 2022-01-28 LAB — POCT GLYCOSYLATED HEMOGLOBIN (HGB A1C): Hemoglobin A1C: 6.3 % — AB (ref 4.0–5.6)

## 2022-01-28 NOTE — Patient Instructions (Signed)

## 2022-01-28 NOTE — Progress Notes (Signed)
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    Past Surgical History:  Procedure Laterality Date   CORONARY ARTERY BYPASS GRAFT     Social History   Socioeconomic History   Marital status: Married    Spouse name: Not on file   Number of children: Not on file   Years of education: Not on file   Highest education level: Not on file  Occupational History   Not on file  Tobacco Use   Smoking status: Every Day   Smokeless tobacco: Never  Vaping Use   Vaping Use: Never used  Substance and Sexual Activity   Alcohol use: No    Alcohol/week: 0.0 standard drinks of alcohol   Drug use: No   Sexual activity: Not on file  Other Topics Concern   Not on file  Social History Narrative   Not on file   Social Determinants of Health   Financial Resource Strain: Not on file  Food Insecurity: Not on file  Transportation Needs: Not on file  Physical Activity: Not on file  Stress: Not on file  Social Connections: Not on file   Outpatient Encounter Medications as of 01/28/2022  Medication Sig   albuterol (PROVENTIL) (2.5 MG/3ML) 0.083% nebulizer solution Inhale 3 mLs into the lungs every 6 (six) hours as needed.   amLODipine (NORVASC) 10 MG tablet Take 10 mg by mouth daily.   ascorbic acid (VITAMIN C) 1000 MG tablet Take by mouth.   aspirin 81 MG tablet Take by mouth daily.    budesonide-formoterol (SYMBICORT) 160-4.5 MCG/ACT inhaler Inhale into the lungs.   carvedilol (COREG) 6.25 MG tablet Take 6.25 mg by mouth 2 (two) times daily.   clopidogrel (PLAVIX) 75 MG tablet Take 75 mg by mouth daily.   ferric citrate (AURYXIA) 1 GM 210  MG(Fe) tablet Take 420 mg by mouth 3 (three) times daily with meals.   fluocinonide (LIDEX) 0.05 % external solution Apply topically.   furosemide (LASIX) 40 MG tablet Take 40 mg by mouth 2 (two) times daily.   glipiZIDE (GLUCOTROL) 10 MG tablet Take by mouth.   Insulin Pen Needle (B-D ULTRAFINE III SHORT PEN) 31G X 8 MM MISC USE AS DIRECTED   lanthanum (FOSRENOL) 1000 MG chewable tablet Chew 1,000 mg by mouth 3 (three) times daily.   levothyroxine (SYNTHROID) 200 MCG tablet Take 1 tablet (200 mcg total) by mouth daily before breakfast.   liraglutide (VICTOZA) 18 MG/3ML SOPN Inject 1.2 mg into the skin daily.   magnesium oxide (MAG-OX) 400 MG tablet Take 1 tablet by mouth daily.   metolazone (ZAROXOLYN) 2.5 MG tablet Take by mouth.   metoprolol tartrate (LOPRESSOR) 100 MG tablet Take 100 mg by mouth 2 (two) times daily.   NATURAL VITAMIN D-3 125 MCG (5000 UT) TABS TAKE 1 TABLET BY MOUTH EVERY DAY   omeprazole (PRILOSEC) 20 MG capsule Take 20 mg by mouth daily.   pregabalin (LYRICA) 75 MG capsule Take 1 capsule (75 mg total) by mouth 2 (two) times  daily.   rosuvastatin (CRESTOR) 20 MG tablet Take 20 mg by mouth daily.   SKYRIZI PEN 150 MG/ML SOAJ    sodium bicarbonate 650 MG tablet Take 1,300 mg by mouth 2 (two) times daily.    SPIRIVA RESPIMAT 2.5 MCG/ACT AERS SMARTSIG:2 Puff(s) Via Inhaler Daily   trazodone (DESYREL) 300 MG tablet Take 300 mg by mouth at bedtime.   TRUE METRIX BLOOD GLUCOSE TEST test strip USE TO TEST BLOOD SUGAR 4 TIMES A DAY   TRUEPLUS LANCETS 28G MISC USE AS DIRECTED 2 TIMES A DAY   No facility-administered encounter medications on file as of 01/28/2022.   ALLERGIES: Allergies  Allergen Reactions   Codeine    Vancomycin    VACCINATION STATUS:  There is no immunization history on file for this patient.  Diabetes He presents for his follow-up diabetic visit. He has type 2 diabetes mellitus. Onset time: He was diagnosed at approximate age of 50 years. His disease  course has been stable. There are no hypoglycemic associated symptoms. Pertinent negatives for hypoglycemia include no confusion, nervousness/anxiousness, pallor, seizures or tremors. Associated symptoms include weight loss. Pertinent negatives for diabetes include no fatigue, no polydipsia, no polyphagia, no polyuria and no weakness. There are no hypoglycemic complications. Symptoms are stable. Diabetic complications include heart disease, nephropathy, peripheral neuropathy and PVD. (Now on hemodialysis.) Risk factors for coronary artery disease include diabetes mellitus, dyslipidemia, male sex, obesity, tobacco exposure, hypertension and sedentary lifestyle. Current diabetic treatments: He is on Victoza 1.2 mg subcutaneously daily. He is compliant with treatment all of the time. His weight is decreasing steadily. He is following a generally healthy diet. When asked about meal planning, he reported none. He has not had a previous visit with a dietitian. He rarely participates in exercise. (He presents today with no meter or logs, does not routinely monitor due to monotherapy with Victoza only.  His POCT A1c today is 6.3%, stable.  He denies any s/s of hypoglycemia.) An ACE inhibitor/angiotensin II receptor blocker is not being taken. He does not see a podiatrist.Eye exam is current.  Hyperlipidemia This is a chronic problem. The current episode started more than 1 year ago. The problem is uncontrolled. Recent lipid tests were reviewed and are variable. Exacerbating diseases include chronic renal disease, diabetes, hypothyroidism and obesity. Factors aggravating his hyperlipidemia include beta blockers and fatty foods. Pertinent negatives include no myalgias. Current antihyperlipidemic treatment includes statins. The current treatment provides moderate improvement of lipids. Compliance problems include adherence to diet and adherence to exercise.  Risk factors for coronary artery disease include dyslipidemia,  diabetes mellitus, hypertension, male sex, obesity and a sedentary lifestyle.  Thyroid Problem Presents for follow-up (He is on levothyroxine 200 mcg p.o. every morning. ) visit. Symptoms include weight loss. Patient reports no anxiety, constipation, diarrhea, fatigue, palpitations, tremors or weight gain. The symptoms have been stable. Past treatments include levothyroxine. His past medical history is significant for diabetes and hyperlipidemia.    Review of systems  Constitutional: + decreasing body weight,  current Body mass index is 32.06 kg/m. , no fatigue, no subjective hyperthermia, no subjective hypothermia Eyes: no blurry vision, no xerophthalmia ENT: no sore throat, no nodules palpated in throat, no dysphagia/odynophagia, no hoarseness Cardiovascular: no chest pain, no shortness of breath, no palpitations, no leg swelling, HD graft left arm Respiratory: no cough, no shortness of breath Gastrointestinal: no nausea/vomiting/diarrhea Musculoskeletal: bilateral knee pain- gets steroid and/or gel shots in knees every 3 months Skin: no rashes, no hyperemia Neurological: no  tremors, no numbness, no tingling, no dizziness Psychiatric: no depression, no anxiety  Objective:    BP (!) 104/59 (BP Location: Right Arm, Patient Position: Sitting, Cuff Size: Large)   Pulse 78   Ht 6' (1.829 m)   Wt 236 lb 6.4 oz (107.2 kg)   BMI 32.06 kg/m   Wt Readings from Last 3 Encounters:  01/28/22 236 lb 6.4 oz (107.2 kg)  07/25/21 250 lb (113.4 kg)  01/24/21 249 lb (112.9 kg)    BP Readings from Last 3 Encounters:  01/28/22 (!) 104/59  07/25/21 135/69  01/24/21 139/67      Physical Exam- Limited  Constitutional:  Body mass index is 32.06 kg/m. , not in acute distress, normal state of mind Eyes:  EOMI, no exophthalmos Neck: Supple Cardiovascular: RRR, no murmurs, rubs, or gallops, no edema Respiratory: Adequate breathing efforts, no crackles, rales, rhonchi, or  wheezing Musculoskeletal: no gross deformities, strength intact in all four extremities, no gross restriction of joint movements Skin:  no rashes, no hyperemia Neurological: no tremor with outstretched hands    Recent Results (from the past 2160 hour(s))  HgB A1c     Status: Abnormal   Collection Time: 01/28/22 11:08 AM  Result Value Ref Range   Hemoglobin A1C 6.3 (A) 4.0 - 5.6 %   HbA1c POC (<> result, manual entry)     HbA1c, POC (prediabetic range)     HbA1c, POC (controlled diabetic range)      Diabetic Foot Exam - Simple   Simple Foot Form Diabetic Foot exam was performed with the following findings: Yes   Visual Inspection See comments: Yes Sensation Testing Intact to touch and monofilament testing bilaterally: Yes Pulse Check Posterior Tibialis and Dorsalis pulse intact bilaterally: Yes Comments Nails in need of trim     Diabetic Labs (most recent): Lab Results  Component Value Date   HGBA1C 6.3 (A) 01/28/2022   HGBA1C 5.4 07/25/2021   HGBA1C 6.3 (A) 01/24/2021    Lipid Panel     Component Value Date/Time   CHOL 131 01/17/2020 0815   TRIG 328 (H) 01/17/2020 0815   HDL 27 (L) 01/17/2020 0815   CHOLHDL 4.9 01/17/2020 0815   LDLCALC 64 01/17/2020 0815     Assessment & Plan:   1) Diabetes mellitus with ESRD ([place])  His diabetes is  complicated by coronary artery disease status post CABG, recent stent placement, ESRD on dialysis, peripheral arterial disease, peripheral neuropathy.  He presents today with no meter or logs, does not routinely monitor due to monotherapy with Victoza only.  His POCT A1c today is 6.3%, stable.  He denies any s/s of hypoglycemia.  - Recent labs reviewed, patient now on hemodialysis for ESRD.  - Patient remains at a high risk for more acute and chronic complications of diabetes which include CAD, CVA, CKD, retinopathy, and neuropathy. These are all discussed in detail with the patient.  - Nutritional counseling repeated at each  appointment due to patients tendency to fall back in to old habits.  - The patient admits there is a room for improvement in their diet and drink choices. -  Suggestion is made for the patient to avoid simple carbohydrates from their diet including Cakes, Sweet Desserts / Pastries, Ice Cream, Soda (diet and regular), Sweet Tea, Candies, Chips, Cookies, Sweet Pastries, Store Bought Juices, Alcohol in Excess of 1-2 drinks a day, Artificial Sweeteners, Coffee Creamer, and "Sugar-free" Products. This will help patient to have stable blood glucose profile and potentially avoid  unintended weight gain.   - I encouraged the patient to switch to unprocessed or minimally processed complex starch and increased protein intake (animal or plant source), fruits, and vegetables.   - Patient is advised to stick to a routine mealtimes to eat 3 meals a day and avoid unnecessary snacks (to snack only to correct hypoglycemia).  - I have approached patient with the following individualized plan to manage diabetes and patient agrees.  -From diabetes point of view, he continues to do well with less medications.  He is advised to continue Victoza 1.2 mg subcutaneously daily.    -He does not need to routinely monitor glucose due to monotherapy with Victoza only, is advised to check his glucose after he gets steroid shots since he tends to increase significantly.  -Patient is not a candidate for metformin and SGLT2 inhibitors due to ESRD.  -He is advised to maintain close follow up with his nephrologist.  - Patient specific target  for A1c; LDL, HDL, Triglycerides, and  Waist Circumference were discussed in detail.  2) BP/HTN:  His blood pressure is controlled to target.  He is advised to continue Norvasc 10 mg po daily, Coreg 6.25 mg po twice daily, Lasix 40 mg po twice daily, and Metoprolol 100 mg po twice daily.  Will defer any med changes to nephrologist.  3) Lipids/HPL:  His most recent lipid panel from 01/17/20  shows improved LDL of 64 and worsening triglycerides of 328.  He is advised to continue Crestor 20 mg po daily before bed.  Side effects and precautions discussed with him.  He follows with cardiology.  4) Primary hypothyroidism -There are no recent TFTs to review.  He is advised to continue Levothyroxine 200 mcg po daily before breakfast.  Will recheck prior to next visit.   - We discussed about the correct intake of his thyroid hormone, on empty stomach at fasting, with water, separated by at least 30 minutes from breakfast and other medications,  and separated by more than 4 hours from calcium, iron, multivitamins, acid reflux medications (PPIs). -Patient is made aware of the fact that thyroid hormone replacement is needed for life, dose to be adjusted by periodic monitoring of thyroid function tests.   5) Vitamin D deficiency -His most recent vitamin D level on 01/17/20 was 71, improving from previous visit.  He is advised to continue OTC Vitamin D 3 5000 units daily as maintenance dose.   I advised patient to maintain close follow up with his PCP for primary care needs.      I spent 38 minutes in the care of the patient today including review of labs from [place], Lipids, Thyroid Function, Hematology (current and previous including abstractions from other facilities); face-to-face time discussing  his blood glucose readings/logs, discussing hypoglycemia and hyperglycemia episodes and symptoms, medications doses, his options of short and long term treatment based on the latest standards of care / guidelines;  discussion about incorporating lifestyle medicine;  and documenting the encounter. Risk reduction counseling performed per USPSTF guidelines to reduce obesity and cardiovascular risk factors.     Please refer to Patient Instructions for Blood Glucose Monitoring and Insulin/Medications Dosing Guide"  in media tab for additional information. Please  also refer to " Patient Self Inventory" in  the Media  tab for reviewed elements of pertinent patient history.  Cory Steele participated in the discussions, expressed understanding, and voiced agreement with the above plans.  All questions were answered to his satisfaction. he is encouraged to  contact clinic should he have any questions or concerns prior to his return visit.  Follow up plan: Return in about 6 months (around 07/29/2022) for Diabetes F/U with A1c in office, Previsit labs.  Rayetta Pigg, [facility] [address] [place], [place] [address] Phone: [phone] Fax: [phone]  01/28/2022, 11:37 AM

## 2022-02-23 ENCOUNTER — Other Ambulatory Visit: Payer: Self-pay | Admitting: Nurse Practitioner

## 2022-05-24 ENCOUNTER — Other Ambulatory Visit: Payer: Self-pay | Admitting: Nurse Practitioner

## 2022-06-19 ENCOUNTER — Other Ambulatory Visit: Payer: Self-pay | Admitting: Nurse Practitioner

## 2022-06-19 DIAGNOSIS — E1122 Type 2 diabetes mellitus with diabetic chronic kidney disease: Secondary | ICD-10-CM

## 2022-07-29 ENCOUNTER — Ambulatory Visit: Payer: Federal, State, Local not specified - PPO | Admitting: Nurse Practitioner

## 2022-08-19 ENCOUNTER — Other Ambulatory Visit: Payer: Self-pay | Admitting: *Deleted

## 2022-08-19 DIAGNOSIS — E782 Mixed hyperlipidemia: Secondary | ICD-10-CM

## 2022-08-19 DIAGNOSIS — E1122 Type 2 diabetes mellitus with diabetic chronic kidney disease: Secondary | ICD-10-CM

## 2022-08-19 DIAGNOSIS — E039 Hypothyroidism, unspecified: Secondary | ICD-10-CM

## 2022-08-20 ENCOUNTER — Other Ambulatory Visit: Payer: Self-pay | Admitting: Nurse Practitioner

## 2022-08-22 ENCOUNTER — Other Ambulatory Visit: Payer: Self-pay | Admitting: Nurse Practitioner

## 2022-08-22 DIAGNOSIS — E1122 Type 2 diabetes mellitus with diabetic chronic kidney disease: Secondary | ICD-10-CM

## 2022-09-22 ENCOUNTER — Other Ambulatory Visit: Payer: Self-pay | Admitting: Nurse Practitioner

## 2022-09-22 DIAGNOSIS — E1122 Type 2 diabetes mellitus with diabetic chronic kidney disease: Secondary | ICD-10-CM

## 2022-10-13 ENCOUNTER — Telehealth: Payer: Self-pay | Admitting: Nurse Practitioner

## 2022-10-13 DIAGNOSIS — E1122 Type 2 diabetes mellitus with diabetic chronic kidney disease: Secondary | ICD-10-CM

## 2022-10-13 MED ORDER — LIRAGLUTIDE 18 MG/3ML ~~LOC~~ SOPN
1.2000 mg | PEN_INJECTOR | Freq: Every day | SUBCUTANEOUS | 3 refills | Status: DC
Start: 2022-10-13 — End: 2022-10-20

## 2022-10-13 NOTE — Telephone Encounter (Signed)
Pt said that his pharmacy has been out of the 2 pack of Victoza for a bit now. They said if we can send in the script for it to be in a 3 pack ,they have that in stock

## 2022-10-13 NOTE — Telephone Encounter (Signed)
done

## 2022-10-20 MED ORDER — LIRAGLUTIDE 18 MG/3ML ~~LOC~~ SOPN: 1.2000 mg | PEN_INJECTOR | Freq: Every day | SUBCUTANEOUS | 3 refills | Status: DC

## 2022-10-20 NOTE — Telephone Encounter (Signed)
Pharmacy said they did not receive anything, can you resend?

## 2022-10-20 NOTE — Addendum Note (Signed)
Addended by: Dani Gobble on: 10/20/2022 11:39 AM   Modules accepted: Orders

## 2022-10-21 LAB — LAB REPORT - SCANNED
EGFR: 8
Free T4: 1.27 ng/dL
TSH: 0.15 — AB (ref 0.41–5.90)

## 2022-10-28 ENCOUNTER — Encounter: Payer: Self-pay | Admitting: Nurse Practitioner

## 2022-10-28 ENCOUNTER — Ambulatory Visit (INDEPENDENT_AMBULATORY_CARE_PROVIDER_SITE_OTHER): Payer: Medicare Other | Admitting: Nurse Practitioner

## 2022-10-28 ENCOUNTER — Telehealth: Payer: Self-pay

## 2022-10-28 VITALS — BP 98/66 | HR 85 | Ht 72.0 in | Wt 234.6 lb

## 2022-10-28 DIAGNOSIS — Z7984 Long term (current) use of oral hypoglycemic drugs: Secondary | ICD-10-CM | POA: Diagnosis not present

## 2022-10-28 DIAGNOSIS — Z7985 Long-term (current) use of injectable non-insulin antidiabetic drugs: Secondary | ICD-10-CM

## 2022-10-28 DIAGNOSIS — E039 Hypothyroidism, unspecified: Secondary | ICD-10-CM

## 2022-10-28 DIAGNOSIS — E782 Mixed hyperlipidemia: Secondary | ICD-10-CM

## 2022-10-28 DIAGNOSIS — E1122 Type 2 diabetes mellitus with diabetic chronic kidney disease: Secondary | ICD-10-CM

## 2022-10-28 DIAGNOSIS — N185 Chronic kidney disease, stage 5: Secondary | ICD-10-CM

## 2022-10-28 MED ORDER — OZEMPIC (0.25 OR 0.5 MG/DOSE) 2 MG/3ML ~~LOC~~ SOPN: 0.5000 mg | PEN_INJECTOR | SUBCUTANEOUS | 1 refills | Status: DC

## 2022-10-28 NOTE — Telephone Encounter (Signed)
Pharmacy Patient Advocate Encounter  Received notification from [facility] that Prior Authorization for Ozempic (0.25 or 0.5 MG/DOSE) 2MG /3ML pen-injectors  has been APPROVED from 10/28/2022 to 10/28/2023  PA #/Case ID/Reference #: Nira Retort

## 2022-10-28 NOTE — Progress Notes (Signed)
10/28/2022              Endocrinology follow-up note   Subjective:    Patient ID: Cory Steele, male    DOB: Jul 29, 1950,    Past Medical History:  Diagnosis Date   CAD (coronary artery disease)    Diabetes mellitus, type II (HCC)    Hyperlipidemia    Hypertension    Hypothyroidism    Kidney disease    Obesity    Uncontrolled type 2 diabetes mellitus with stage 5 chronic kidney disease    Past Surgical History:  Procedure Laterality Date   CORONARY ARTERY BYPASS GRAFT     Social History   Socioeconomic History   Marital status: Married    Spouse name: Not on file   Number of children: Not on file   Years of education: Not on file   Highest education level: Not on file  Occupational History   Not on file  Tobacco Use   Smoking status: Every Day   Smokeless tobacco: Never  Vaping Use   Vaping status: Never Used  Substance and Sexual Activity   Alcohol use: No    Alcohol/week: 0.0 standard drinks of alcohol   Drug use: No   Sexual activity: Not on file  Other Topics Concern   Not on file  Social History Narrative   Not on file   Social Determinants of Health   Financial Resource Strain: Medium Risk (07/24/2017)   Received from [facility], [facility]   Overall Financial Resource Strain (CARDIA)    Difficulty of Paying Living Expenses: Somewhat hard  Food Insecurity: No Food Insecurity (07/24/2017)   Received from [facility], [facility]   Hunger Vital Sign    Worried About Running Out of Food in the Last Year: Never true    Ran Out of Food in the Last Year: Never true  Transportation Needs: No Transportation Needs (08/22/2021)   Received from [facility], [facility]   [facility] - Transportation    In the past 12 months, has lack of transportation kept you from medical appointments or from getting medications?: No    Lack of Transportation  (Non-Medical): No  Physical Activity: Inactive (07/24/2017)   Received from [facility], [facility]   Exercise Vital Sign    Days of Exercise per Week: 0 days    Minutes of Exercise per Session: 0 min  Stress: Stress Concern Present (07/24/2017)   Received from [facility], [facility]   [employer] [facility] [employer] [facility] - Occupational Stress Questionnaire    Feeling of Stress : To some extent  Social Connections: Unknown (07/24/2017)   Received from [facility], [facility]   Social Connection and Isolation Panel [NHANES]    Frequency of Communication with Friends and Family: More than three times a week    Frequency of Social Gatherings with Friends and Family: More than three times a week    Attends Religious Services: Not on file    Active Member of Clubs or Organizations: Not on file    Attends Banker Meetings: Not on file    Marital Status: Married   Outpatient Encounter Medications as of 10/28/2022  Medication Sig   albuterol (PROVENTIL) (2.5 MG/3ML) 0.083% nebulizer solution Inhale 3 mLs into the lungs every 6 (six) hours as needed.   ascorbic acid (VITAMIN C)  1000 MG tablet Take by mouth.   aspirin 81 MG tablet Take by mouth daily.    budesonide-formoterol (SYMBICORT) 160-4.5 MCG/ACT inhaler Inhale into the lungs.   clopidogrel (PLAVIX) 75 MG tablet Take 75 mg by mouth daily.   ferric citrate (AURYXIA) 1 GM 210 MG(Fe) tablet Take 420 mg by mouth 3 (three) times daily with meals.   fluocinonide (LIDEX) 0.05 % external solution Apply topically.   glipiZIDE (GLUCOTROL) 10 MG tablet Take by mouth.   Insulin Pen Needle (B-D ULTRAFINE III SHORT PEN) 31G X 8 MM MISC USE AS DIRECTED   lanthanum (FOSRENOL) 1000 MG chewable tablet Chew 1,000 mg by mouth 3 (three) times daily.   levothyroxine (SYNTHROID) 200 MCG tablet TAKE 1 TABLET (200 MCG TOTAL) BY MOUTH DAILY  BEFORE BREAKFAST.   magnesium oxide (MAG-OX) 400 MG tablet Take 1 tablet by mouth daily.   metolazone (ZAROXOLYN) 2.5 MG tablet Take by mouth.   midodrine (PROAMATINE) 10 MG tablet Take 10 mg by mouth daily. Patient takes 2 (two) 5 mg tablets in the morning   NATURAL VITAMIN D-3 125 MCG (5000 UT) TABS TAKE 1 TABLET BY MOUTH EVERY DAY   omeprazole (PRILOSEC) 20 MG capsule Take 20 mg by mouth daily.   pregabalin (LYRICA) 75 MG capsule Take 1 capsule (75 mg total) by mouth 2 (two) times daily.   rosuvastatin (CRESTOR) 20 MG tablet Take 20 mg by mouth daily.   Semaglutide,0.25 or 0.5MG /DOS, (OZEMPIC, 0.25 OR 0.5 MG/DOSE,) 2 MG/3ML SOPN Inject 0.5 mg into the skin once a week.   SKYRIZI PEN 150 MG/ML SOAJ    sodium bicarbonate 650 MG tablet Take 1,300 mg by mouth 2 (two) times daily.    SPIRIVA RESPIMAT 2.5 MCG/ACT AERS SMARTSIG:2 Puff(s) Via Inhaler Daily   trazodone (DESYREL) 300 MG tablet Take 300 mg by mouth at bedtime.   TRUE METRIX BLOOD GLUCOSE TEST test strip USE TO TEST BLOOD SUGAR 4 TIMES A DAY   TRUEPLUS LANCETS 28G MISC USE AS DIRECTED 2 TIMES A DAY   [DISCONTINUED] liraglutide (VICTOZA) 18 MG/3ML SOPN Inject 1.2 mg into the skin daily.   [DISCONTINUED] amLODipine (NORVASC) 10 MG tablet Take 10 mg by mouth daily. (Patient not taking: Reported on 10/28/2022)   [DISCONTINUED] carvedilol (COREG) 6.25 MG tablet Take 6.25 mg by mouth 2 (two) times daily. (Patient not taking: Reported on 10/28/2022)   [DISCONTINUED] furosemide (LASIX) 40 MG tablet Take 40 mg by mouth 2 (two) times daily. (Patient not taking: Reported on 10/28/2022)   [DISCONTINUED] metoprolol tartrate (LOPRESSOR) 100 MG tablet Take 100 mg by mouth 2 (two) times daily. (Patient not taking: Reported on 10/28/2022)   No facility-administered encounter medications on file as of 10/28/2022.   ALLERGIES: Allergies  Allergen Reactions   Codeine    Vancomycin    VACCINATION STATUS:  There is no immunization history on file for this  patient.  Diabetes He presents for his follow-up diabetic visit. He has type 2 diabetes mellitus. Onset time: He was diagnosed at approximate age of 42 years. His disease course has been improving. There are no hypoglycemic associated symptoms. Pertinent negatives for hypoglycemia include no confusion, nervousness/anxiousness, pallor, seizures or tremors. Associated symptoms include weight loss. Pertinent negatives for diabetes include no fatigue, no polydipsia, no polyphagia, no polyuria and no weakness. There are no hypoglycemic complications. Symptoms are stable. Diabetic complications include heart disease, nephropathy, peripheral neuropathy and PVD. (Now on hemodialysis.) Risk factors for coronary artery disease include diabetes mellitus, dyslipidemia, male sex,  obesity, tobacco exposure, hypertension and sedentary lifestyle. Current diabetic treatments: He is on Victoza 1.2 mg subcutaneously daily. He is compliant with treatment all of the time (has not been able to get his Victoza for over 1 month due to shortage). His weight is decreasing steadily. He is following a generally healthy diet. When asked about meal planning, he reported none. He has not had a previous visit with a dietitian. He rarely participates in exercise. (He presents today with no meter or logs, does not routinely monitor due to monotherapy with Victoza only.  His previsit A1c, done at HD on 5/31 was 5.1%.  He notes he has been unable to get his Victoza in over a month due to drug shortage issues.) An ACE inhibitor/angiotensin II receptor blocker is not being taken. He does not see a podiatrist.Eye exam is current.  Hyperlipidemia This is a chronic problem. The current episode started more than 1 year ago. The problem is uncontrolled. Recent lipid tests were reviewed and are variable. Exacerbating diseases include chronic renal disease, diabetes, hypothyroidism and obesity. Factors aggravating his hyperlipidemia include beta blockers  and fatty foods. Pertinent negatives include no myalgias. Current antihyperlipidemic treatment includes statins. The current treatment provides moderate improvement of lipids. Compliance problems include adherence to diet and adherence to exercise.  Risk factors for coronary artery disease include dyslipidemia, diabetes mellitus, hypertension, male sex, obesity and a sedentary lifestyle.  Thyroid Problem Presents for follow-up (He is on levothyroxine 200 mcg p.o. every morning. ) visit. Symptoms include weight loss. Patient reports no anxiety, constipation, diarrhea, fatigue, palpitations, tremors or weight gain. The symptoms have been stable. Past treatments include levothyroxine. His past medical history is significant for diabetes and hyperlipidemia.    Review of systems  Constitutional: + decreasing body weight,  current Body mass index is 31.82 kg/m. , no fatigue, no subjective hyperthermia, no subjective hypothermia Eyes: no blurry vision, no xerophthalmia ENT: no sore throat, no nodules palpated in throat, no dysphagia/odynophagia, no hoarseness Cardiovascular: no chest pain, no shortness of breath, no palpitations, no leg swelling, HD graft left arm Respiratory: no cough, no shortness of breath Gastrointestinal: no nausea/vomiting/diarrhea Musculoskeletal: bilateral knee pain- gets steroid and/or gel shots in knees every 3 months Skin: no rashes, no hyperemia Neurological: no tremors, no numbness, no tingling, no dizziness Psychiatric: no depression, no anxiety  Objective:    BP 98/66 (BP Location: Right Arm, Patient Position: Sitting, Cuff Size: Large)   Pulse 85   Ht 6' (1.829 m)   Wt 234 lb 9.6 oz (106.4 kg)   BMI 31.82 kg/m   Wt Readings from Last 3 Encounters:  10/28/22 234 lb 9.6 oz (106.4 kg)  01/28/22 236 lb 6.4 oz (107.2 kg)  07/25/21 250 lb (113.4 kg)    BP Readings from Last 3 Encounters:  10/28/22 98/66  01/28/22 (!) 104/59  07/25/21 135/69      Physical  Exam- Limited  Constitutional:  Body mass index is 31.82 kg/m. , not in acute distress, normal state of mind Eyes:  EOMI, no exophthalmos Musculoskeletal: no gross deformities, strength intact in all four extremities, no gross restriction of joint movements Skin:  no rashes, no hyperemia Neurological: no tremor with outstretched hands    No results found for this or any previous visit (from the past 2160 hour(s)).   Diabetic Foot Exam - Simple   No data filed     Diabetic Labs (most recent): Lab Results  Component Value Date   HGBA1C 6.3 (A) 01/28/2022  HGBA1C 5.4 07/25/2021   HGBA1C 6.3 (A) 01/24/2021    Lipid Panel     Component Value Date/Time   CHOL 131 01/17/2020 0815   TRIG 328 (H) 01/17/2020 0815   HDL 27 (L) 01/17/2020 0815   CHOLHDL 4.9 01/17/2020 0815   LDLCALC 64 01/17/2020 0815     Assessment & Plan:   1) Diabetes mellitus with ESRD (HCC)  His diabetes is  complicated by coronary artery disease status post CABG, recent stent placement, ESRD on dialysis, peripheral arterial disease, peripheral neuropathy.  He presents today with no meter or logs, does not routinely monitor due to monotherapy with Victoza only.  His previsit A1c, done at HD on 5/31 was 5.1%.  He notes he has been unable to get his Victoza in over a month due to drug shortage issues.  - Recent labs reviewed, patient now on hemodialysis for ESRD.  - Patient remains at a high risk for more acute and chronic complications of diabetes which include CAD, CVA, CKD, retinopathy, and neuropathy. These are all discussed in detail with the patient.  - Nutritional counseling repeated at each appointment due to patients tendency to fall back in to old habits.  - The patient admits there is a room for improvement in their diet and drink choices. -  Suggestion is made for the patient to avoid simple carbohydrates from their diet including Cakes, Sweet Desserts / Pastries, Ice Cream, Soda (diet and  regular), Sweet Tea, Candies, Chips, Cookies, Sweet Pastries, Store Bought Juices, Alcohol in Excess of 1-2 drinks a day, Artificial Sweeteners, Coffee Creamer, and "Sugar-free" Products. This will help patient to have stable blood glucose profile and potentially avoid unintended weight gain.   - I encouraged the patient to switch to unprocessed or minimally processed complex starch and increased protein intake (animal or plant source), fruits, and vegetables.   - Patient is advised to stick to a routine mealtimes to eat 3 meals a day and avoid unnecessary snacks (to snack only to correct hypoglycemia).  - I have approached patient with the following individualized plan to manage diabetes and patient agrees.  -From diabetes point of view, he continues to do well with less medications.  Given his inability to get his Victoza, will change him to Ozempic 0.25 mg SQ weekly x 2 weeks, then increase to 0.5 mg SQ weekly thereafter (sample pen provided from office today).  He understands this is a once weekly injection rather than daily he is used to.  -He does not need to routinely monitor glucose due to monotherapy with Ozempic only, is advised to check his glucose after he gets steroid shots since he tends to increase significantly.  -Patient is not a candidate for metformin and SGLT2 inhibitors due to ESRD.  -He is advised to maintain close follow up with his nephrologist.  - Patient specific target  for A1c; LDL, HDL, Triglycerides, and  Waist Circumference were discussed in detail.  2) BP/HTN:  His blood pressure is controlled to target.  In fact, he was taken off all his BP meds due to hypotension, now he is on Midodrine 10 mg po daily to help bring BP up.  3) Lipids/HPL:  His most recent lipid panel from 10/21/22 shows controlled LDL of 49.  He is advised to continue Crestor 20 mg po daily before bed.  Side effects and precautions discussed with him.  He follows with cardiology.  4) Primary  hypothyroidism -His previsit TFTs are consistent with appropriate hormone replacement (TSH  slightly low, but FT4 normal and he does not report any symptoms of over-replacement).  He is advised to continue Levothyroxine 200 mcg po daily before breakfast.  Will recheck prior to next visit now since adding him on Ozempic which may impact proper absorption.   - We discussed about the correct intake of his thyroid hormone, on empty stomach at fasting, with water, separated by at least 30 minutes from breakfast and other medications,  and separated by more than 4 hours from calcium, iron, multivitamins, acid reflux medications (PPIs). -Patient is made aware of the fact that thyroid hormone replacement is needed for life, dose to be adjusted by periodic monitoring of thyroid function tests.   5) Vitamin D deficiency -His most recent vitamin D level on 01/17/20 was 71, improving from previous visit.  He is advised to continue OTC Vitamin D 3 5000 units daily as maintenance dose.   I advised patient to maintain close follow up with his PCP for primary care needs.     I spent  39  minutes in the care of the patient today including review of labs from CMP, Lipids, Thyroid Function, Hematology (current and previous including abstractions from other facilities); face-to-face time discussing  his blood glucose readings/logs, discussing hypoglycemia and hyperglycemia episodes and symptoms, medications doses, his options of short and long term treatment based on the latest standards of care / guidelines;  discussion about incorporating lifestyle medicine;  and documenting the encounter. Risk reduction counseling performed per USPSTF guidelines to reduce obesity and cardiovascular risk factors.     Please refer to Patient Instructions for Blood Glucose Monitoring and Insulin/Medications Dosing Guide"  in media tab for additional information. Please  also refer to " Patient Self Inventory" in the Media  tab for  reviewed elements of pertinent patient history.  Cory Steele participated in the discussions, expressed understanding, and voiced agreement with the above plans.  All questions were answered to his satisfaction. he is encouraged to contact clinic should he have any questions or concerns prior to his return visit.  Follow up plan: Return in about 4 months (around 02/27/2023) for Diabetes F/U with A1c in office, Thyroid follow up, Previsit labs.  Ronny Bacon, [facility], [facility] [address] [place], [place] [address] Phone: [phone] Fax: [phone]  10/28/2022, 9:24 AM

## 2023-01-01 ENCOUNTER — Other Ambulatory Visit: Payer: Self-pay | Admitting: Nurse Practitioner

## 2023-02-09 ENCOUNTER — Other Ambulatory Visit (HOSPITAL_COMMUNITY): Payer: Self-pay

## 2023-02-26 ENCOUNTER — Encounter: Payer: Self-pay | Admitting: Nurse Practitioner

## 2023-02-26 LAB — TSH: TSH: 0.08 — AB (ref 0.41–5.90)

## 2023-02-27 ENCOUNTER — Other Ambulatory Visit: Payer: Self-pay | Admitting: Nurse Practitioner

## 2023-03-03 ENCOUNTER — Ambulatory Visit: Payer: Federal, State, Local not specified - PPO | Admitting: Nurse Practitioner

## 2023-04-24 ENCOUNTER — Other Ambulatory Visit: Payer: Self-pay | Admitting: Nurse Practitioner

## 2023-04-29 LAB — HEPATIC FUNCTION PANEL
ALT: 31 U/L (ref 10–40)
AST: 25 (ref 14–40)
Alkaline Phosphatase: 154 — AB (ref 25–125)
Bilirubin, Total: 0.3

## 2023-04-29 LAB — BASIC METABOLIC PANEL
BUN: 44 — AB (ref 4–21)
Chloride: 99 (ref 99–108)
Creatinine: 7.8 — AB (ref 0.6–1.3)
Glucose: 83
Potassium: 5 meq/L (ref 3.5–5.1)
Sodium: 138 (ref 137–147)

## 2023-04-29 LAB — COMPREHENSIVE METABOLIC PANEL
Albumin: 4.5 (ref 3.5–5.0)
Calcium: 10.4 (ref 8.7–10.7)

## 2023-04-29 LAB — HEMOGLOBIN A1C: Hemoglobin A1C: 5.7

## 2023-04-29 LAB — LIPID PANEL
HDL: 30 — AB (ref 35–70)
LDL Cholesterol: 39
Triglycerides: 156 (ref 40–160)

## 2023-04-29 LAB — TSH: TSH: 0.09 — AB (ref 0.41–5.90)

## 2023-05-07 ENCOUNTER — Encounter: Payer: Self-pay | Admitting: Nurse Practitioner

## 2023-05-07 ENCOUNTER — Other Ambulatory Visit: Payer: Self-pay | Admitting: *Deleted

## 2023-05-07 ENCOUNTER — Ambulatory Visit (INDEPENDENT_AMBULATORY_CARE_PROVIDER_SITE_OTHER): Payer: Federal, State, Local not specified - PPO | Admitting: Nurse Practitioner

## 2023-05-07 VITALS — BP 102/64 | HR 84 | Ht 72.0 in | Wt 227.4 lb

## 2023-05-07 DIAGNOSIS — Z7984 Long term (current) use of oral hypoglycemic drugs: Secondary | ICD-10-CM | POA: Diagnosis not present

## 2023-05-07 DIAGNOSIS — E1122 Type 2 diabetes mellitus with diabetic chronic kidney disease: Secondary | ICD-10-CM

## 2023-05-07 DIAGNOSIS — E039 Hypothyroidism, unspecified: Secondary | ICD-10-CM | POA: Diagnosis not present

## 2023-05-07 DIAGNOSIS — E782 Mixed hyperlipidemia: Secondary | ICD-10-CM | POA: Diagnosis not present

## 2023-05-07 DIAGNOSIS — N185 Chronic kidney disease, stage 5: Secondary | ICD-10-CM

## 2023-05-07 DIAGNOSIS — Z7985 Long-term (current) use of injectable non-insulin antidiabetic drugs: Secondary | ICD-10-CM

## 2023-05-07 MED ORDER — OZEMPIC (0.25 OR 0.5 MG/DOSE) 2 MG/3ML ~~LOC~~ SOPN: 0.5000 mg | PEN_INJECTOR | SUBCUTANEOUS | 1 refills | Status: DC

## 2023-05-07 MED ORDER — LEVOTHYROXINE SODIUM 150 MCG PO TABS: 150.0000 ug | ORAL_TABLET | Freq: Every day | ORAL | 3 refills | Status: DC

## 2023-05-07 NOTE — Progress Notes (Signed)
05/07/2023              Endocrinology follow-up note   Subjective:    Patient ID: Cory Steele, male    DOB: Nov 01, 1950,    Past Medical History:  Diagnosis Date   CAD (coronary artery disease)    Diabetes mellitus, type II (HCC)    Hyperlipidemia    Hypertension    Hypothyroidism    Kidney disease    Obesity    Uncontrolled type 2 diabetes mellitus with stage 5 chronic kidney disease    Past Surgical History:  Procedure Laterality Date   CORONARY ARTERY BYPASS GRAFT     Social History   Socioeconomic History   Marital status: Married    Spouse name: Not on file   Number of children: Not on file   Years of education: Not on file   Highest education level: Not on file  Occupational History   Not on file  Tobacco Use   Smoking status: Every Day   Smokeless tobacco: Never  Vaping Use   Vaping status: Never Used  Substance and Sexual Activity   Alcohol use: No    Alcohol/week: 0.0 standard drinks of alcohol   Drug use: No   Sexual activity: Not on file  Other Topics Concern   Not on file  Social History Narrative   Not on file   Social Drivers of Health   Financial Resource Strain: Medium Risk (07/24/2017)   Received from [facility], [facility]   Overall Financial Resource Strain (CARDIA)    Difficulty of Paying Living Expenses: Somewhat hard  Food Insecurity: No Food Insecurity (07/24/2017)   Received from [facility], [facility]   Hunger Vital Sign    Worried About Running Out of Food in the Last Year: Never true    Ran Out of Food in the Last Year: Never true  Transportation Needs: No Transportation Needs (08/22/2021)   Received from [facility], [facility]   [facility] - Transportation    In the past 12 months, has lack of transportation kept you from medical appointments or from getting medications?: No    Lack of Transportation (Non-Medical):  No  Physical Activity: Inactive (07/24/2017)   Received from [facility], [facility]   Exercise Vital Sign    Days of Exercise per Week: 0 days    Minutes of Exercise per Session: 0 min  Stress: Stress Concern Present (07/24/2017)   Received from [facility], [facility]   [employer] [facility] - Occupational Stress Questionnaire    Feeling of Stress : To some extent  Social Connections: Unknown (07/24/2017)   Received from [facility], [facility]   Social Connection and Isolation Panel [NHANES]    Frequency of Communication with Friends and Family: More than three times a week    Frequency of Social Gatherings with Friends and Family: More than three times a week    Attends Religious Services: Not on file    Active Member of Clubs or Organizations: Not on file    Attends Banker Meetings: Not on file    Marital Status: Married   Outpatient Encounter Medications as of 05/07/2023  Medication Sig   albuterol (PROVENTIL) (2.5 MG/3ML) 0.083% nebulizer solution Inhale 3 mLs into the lungs every 6 (six) hours as needed.   ascorbic acid (VITAMIN C)  1000 MG tablet Take by mouth.   aspirin 81 MG tablet Take by mouth daily.    budesonide-formoterol (SYMBICORT) 160-4.5 MCG/ACT inhaler Inhale into the lungs.   clopidogrel (PLAVIX) 75 MG tablet Take 75 mg by mouth daily.   ferric citrate (AURYXIA) 1 GM 210 MG(Fe) tablet Take 420 mg by mouth 3 (three) times daily with meals.   fluocinonide (LIDEX) 0.05 % external solution Apply topically.   Insulin Pen Needle (B-D ULTRAFINE III SHORT PEN) 31G X 8 MM MISC USE AS DIRECTED   lanthanum (FOSRENOL) 1000 MG chewable tablet Chew 1,000 mg by mouth 3 (three) times daily.   magnesium oxide (MAG-OX) 400 MG tablet Take 1 tablet by mouth daily.   metolazone (ZAROXOLYN) 2.5 MG tablet Take by mouth.   midodrine (PROAMATINE) 10 MG  tablet Take 10 mg by mouth daily. Patient takes 2 (two) 5 mg tablets in the morning   NATURAL VITAMIN D-3 125 MCG (5000 UT) TABS TAKE 1 TABLET BY MOUTH EVERY DAY   omeprazole (PRILOSEC) 20 MG capsule Take 20 mg by mouth daily.   pregabalin (LYRICA) 75 MG capsule Take 1 capsule (75 mg total) by mouth 2 (two) times daily.   rosuvastatin (CRESTOR) 20 MG tablet Take 20 mg by mouth daily.   SKYRIZI PEN 150 MG/ML SOAJ    sodium bicarbonate 650 MG tablet Take 1,300 mg by mouth 2 (two) times daily.    SPIRIVA RESPIMAT 2.5 MCG/ACT AERS SMARTSIG:2 Puff(s) Via Inhaler Daily   trazodone (DESYREL) 300 MG tablet Take 300 mg by mouth at bedtime.   TRUE METRIX BLOOD GLUCOSE TEST test strip USE TO TEST BLOOD SUGAR 4 TIMES A DAY   TRUEPLUS LANCETS 28G MISC USE AS DIRECTED 2 TIMES A DAY   [DISCONTINUED] levothyroxine (SYNTHROID) 200 MCG tablet TAKE 1 TABLET (200 MCG TOTAL) BY MOUTH DAILY BEFORE BREAKFAST.   [DISCONTINUED] Semaglutide,0.25 or 0.5MG /DOS, (OZEMPIC, 0.25 OR 0.5 MG/DOSE,) 2 MG/3ML SOPN INJECT 0.5 MG INTO THE SKIN ONE TIME PER WEEK   glipiZIDE (GLUCOTROL) 10 MG tablet Take by mouth. (Patient not taking: Reported on 05/07/2023)   levothyroxine (SYNTHROID) 150 MCG tablet Take 1 tablet (150 mcg total) by mouth daily before breakfast.   Semaglutide,0.25 or 0.5MG /DOS, (OZEMPIC, 0.25 OR 0.5 MG/DOSE,) 2 MG/3ML SOPN Inject 0.5 mg into the skin once a week.   No facility-administered encounter medications on file as of 05/07/2023.   ALLERGIES: Allergies  Allergen Reactions   Codeine    Vancomycin    VACCINATION STATUS:  There is no immunization history on file for this patient.  Diabetes He presents for his follow-up diabetic visit. He has type 2 diabetes mellitus. Onset time: He was diagnosed at approximate age of 42 years. His disease course has been improving. There are no hypoglycemic associated symptoms. Pertinent negatives for hypoglycemia include no confusion, nervousness/anxiousness, pallor,  seizures or tremors. Associated symptoms include weight loss. Pertinent negatives for diabetes include no fatigue, no polydipsia, no polyphagia, no polyuria and no weakness. There are no hypoglycemic complications. Symptoms are stable. Diabetic complications include heart disease, nephropathy, peripheral neuropathy and PVD. (Now on hemodialysis.) Risk factors for coronary artery disease include diabetes mellitus, dyslipidemia, male sex, obesity, tobacco exposure, hypertension and sedentary lifestyle. Current diabetic treatments: Ozempic 0.5 mg SQ weekly. He is compliant with treatment all of the time. His weight is decreasing steadily. He is following a generally healthy diet. When asked about meal planning, he reported none. He has not had a previous visit with a dietitian. He rarely  participates in exercise. (He presents today with no meter or logs, does not routinely monitor due to monotherapy with Ozempic only.  His previsit A1c done on 2/4 was 5.7%, improving from last visit of 6.3%.  He has tolerated the switch to Ozempic well.) An ACE inhibitor/angiotensin II receptor blocker is not being taken. He does not see a podiatrist.Eye exam is current.  Hyperlipidemia This is a chronic problem. The current episode started more than 1 year ago. The problem is uncontrolled. Recent lipid tests were reviewed and are variable. Exacerbating diseases include chronic renal disease, diabetes, hypothyroidism and obesity. Factors aggravating his hyperlipidemia include beta blockers and fatty foods. Pertinent negatives include no myalgias. Current antihyperlipidemic treatment includes statins. The current treatment provides moderate improvement of lipids. Compliance problems include adherence to diet and adherence to exercise.  Risk factors for coronary artery disease include dyslipidemia, diabetes mellitus, hypertension, male sex, obesity and a sedentary lifestyle.  Thyroid Problem Presents for follow-up (He is on  levothyroxine 200 mcg p.o. every morning. ) visit. Symptoms include weight loss. Patient reports no anxiety, constipation, diarrhea, fatigue, palpitations, tremors or weight gain. The symptoms have been stable. His past medical history is significant for diabetes.    Review of systems  Constitutional: + decreasing body weight,  current Body mass index is 30.84 kg/m. , no fatigue, no subjective hyperthermia, no subjective hypothermia Eyes: no blurry vision, no xerophthalmia ENT: no sore throat, no nodules palpated in throat, no dysphagia/odynophagia, no hoarseness Cardiovascular: no chest pain, no shortness of breath, no palpitations, no leg swelling, HD graft left arm Respiratory: no cough, no shortness of breath Gastrointestinal: no nausea/vomiting/diarrhea Musculoskeletal: bilateral knee pain- gets steroid and/or gel shots in knees every 3 months Skin: no rashes, no hyperemia Neurological: no tremors, no numbness, no tingling, no dizziness Psychiatric: no depression, no anxiety  Objective:    BP 102/64 (BP Location: Right Arm, Patient Position: Sitting, Cuff Size: Large)   Pulse 84   Ht 6' (1.829 m)   Wt 227 lb 6.4 oz (103.1 kg)   BMI 30.84 kg/m   Wt Readings from Last 3 Encounters:  05/07/23 227 lb 6.4 oz (103.1 kg)  10/28/22 234 lb 9.6 oz (106.4 kg)  01/28/22 236 lb 6.4 oz (107.2 kg)    BP Readings from Last 3 Encounters:  05/07/23 102/64  10/28/22 98/66  01/28/22 (!) 104/59     Physical Exam- Limited  Constitutional:  Body mass index is 30.84 kg/m. , not in acute distress, normal state of mind Eyes:  EOMI, no exophthalmos Musculoskeletal: no gross deformities, strength intact in all four extremities, no gross restriction of joint movements Skin:  no rashes, no hyperemia Neurological: no tremor with outstretched hands    Recent Results (from the past 2160 hours)  TSH     Status: Abnormal   Collection Time: 02/26/23 12:00 AM  Result Value Ref Range   TSH 0.08  (A) 0.41 - 5.90    Comment: Free T4 1.56  Hemoglobin A1c     Status: None   Collection Time: 04/29/23 12:00 AM  Result Value Ref Range   Hemoglobin A1C 5.7   TSH     Status: Abnormal   Collection Time: 04/29/23 12:00 AM  Result Value Ref Range   TSH 0.09 (A) 0.41 - 5.90    Comment: Free T4 1.54     Diabetic Foot Exam - Simple   No data filed     Diabetic Labs (most recent): Lab Results  Component Value Date  HGBA1C 5.7 04/29/2023   HGBA1C 6.3 (A) 01/28/2022   HGBA1C 5.4 07/25/2021    Lipid Panel     Component Value Date/Time   CHOL 131 01/17/2020 0815   TRIG 328 (H) 01/17/2020 0815   HDL 27 (L) 01/17/2020 0815   CHOLHDL 4.9 01/17/2020 0815   LDLCALC 64 01/17/2020 0815     Assessment & Plan:   1) Diabetes mellitus with ESRD (HCC)  His diabetes is  complicated by coronary artery disease status post CABG, recent stent placement, ESRD on dialysis, peripheral arterial disease, peripheral neuropathy.  He presents today with no meter or logs, does not routinely monitor due to monotherapy with Ozempic only.  His previsit A1c done on 2/4 was 5.7%, improving from last visit of 6.3%.  He has tolerated the switch to Ozempic well.  - Recent labs reviewed, patient now on hemodialysis for ESRD.  - Patient remains at a high risk for more acute and chronic complications of diabetes which include CAD, CVA, CKD, retinopathy, and neuropathy. These are all discussed in detail with the patient.  - Nutritional counseling repeated at each appointment due to patients tendency to fall back in to old habits.  - The patient admits there is a room for improvement in their diet and drink choices. -  Suggestion is made for the patient to avoid simple carbohydrates from their diet including Cakes, Sweet Desserts / Pastries, Ice Cream, Soda (diet and regular), Sweet Tea, Candies, Chips, Cookies, Sweet Pastries, Store Bought Juices, Alcohol in Excess of 1-2 drinks a day, Artificial Sweeteners,  Coffee Creamer, and "Sugar-free" Products. This will help patient to have stable blood glucose profile and potentially avoid unintended weight gain.   - I encouraged the patient to switch to unprocessed or minimally processed complex starch and increased protein intake (animal or plant source), fruits, and vegetables.   - Patient is advised to stick to a routine mealtimes to eat 3 meals a day and avoid unnecessary snacks (to snack only to correct hypoglycemia).  - I have approached patient with the following individualized plan to manage diabetes and patient agrees.  -He is advised to continue Ozempic 0.5 mg SQ weekly.  -He does not need to routinely monitor glucose due to monotherapy with Ozempic only, is advised to check his glucose after he gets steroid shots since he tends to increase significantly.  -Patient is not a candidate for metformin and SGLT2 inhibitors due to ESRD.  -He is advised to maintain close follow up with his nephrologist.  - Patient specific target  for A1c; LDL, HDL, Triglycerides, and  Waist Circumference were discussed in detail.  2) BP/HTN:  His blood pressure is controlled to target.  In fact, he was taken off all his BP meds due to hypotension, now he is on Midodrine 10 mg po daily to help bring BP up.  3) Lipids/HPL:  His most recent lipid panel from 10/21/22 shows controlled LDL of 49.  He is advised to continue Crestor 20 mg po daily before bed.  Side effects and precautions discussed with him.  He follows with cardiology.  Lipid panel was recently done by PCP, will be abstracting into the system soon.  4) Primary hypothyroidism -His previsit TFTs are consistent with over-replacement yet he denies any overt symptoms of such.  This is likely due to his recent weight loss.  He is advised to lower his Levothyroxine to 150 mcg po daily before breakfast.  Will recheck prior to next visit and adjust dose accordingly.   -  We discussed about the correct intake of his  thyroid hormone, on empty stomach at fasting, with water, separated by at least 30 minutes from breakfast and other medications,  and separated by more than 4 hours from calcium, iron, multivitamins, acid reflux medications (PPIs). -Patient is made aware of the fact that thyroid hormone replacement is needed for life, dose to be adjusted by periodic monitoring of thyroid function tests.   5) Vitamin D deficiency -His most recent vitamin D level on 01/17/20 was 71, improving from previous visit.  He is advised to continue OTC Vitamin D 3 5000 units daily as maintenance dose.   I advised patient to maintain close follow up with his PCP for primary care needs.     I spent  27  minutes in the care of the patient today including review of labs from CMP, Lipids, Thyroid Function, Hematology (current and previous including abstractions from other facilities); face-to-face time discussing  his blood glucose readings/logs, discussing hypoglycemia and hyperglycemia episodes and symptoms, medications doses, his options of short and long term treatment based on the latest standards of care / guidelines;  discussion about incorporating lifestyle medicine;  and documenting the encounter. Risk reduction counseling performed per USPSTF guidelines to reduce obesity and cardiovascular risk factors.     Please refer to Patient Instructions for Blood Glucose Monitoring and Insulin/Medications Dosing Guide"  in media tab for additional information. Please  also refer to " Patient Self Inventory" in the Media  tab for reviewed elements of pertinent patient history.  Cory Steele participated in the discussions, expressed understanding, and voiced agreement with the above plans.  All questions were answered to his satisfaction. he is encouraged to contact clinic should he have any questions or concerns prior to his return visit.   Follow up plan: Return in about 4 months (around 09/04/2023) for Diabetes F/U with A1c in  office, Thyroid follow up, Previsit labs.  Ronny Bacon, [facility] [address] [place], [place] [address] Phone: [phone] Fax: [phone]  05/07/2023, 8:33 AM

## 2023-05-07 NOTE — Patient Instructions (Signed)

## 2023-06-23 DEATH — deceased

## 2023-09-08 ENCOUNTER — Ambulatory Visit: Payer: Federal, State, Local not specified - PPO | Admitting: Nurse Practitioner

## 2023-10-29 ENCOUNTER — Other Ambulatory Visit (HOSPITAL_COMMUNITY): Payer: Self-pay

## 2023-10-29 ENCOUNTER — Encounter: Payer: Self-pay | Admitting: Pharmacy Technician

## 2023-10-29 NOTE — Telephone Encounter (Signed)
 error

## 2023-11-02 ENCOUNTER — Telehealth: Payer: Self-pay

## 2023-11-02 NOTE — Telephone Encounter (Signed)
 Patient notes show as deceased however chart is still active. Please correct.
